# Patient Record
Sex: Male | Born: 1960 | Race: Black or African American | Hispanic: No | Marital: Single | Smoking: Former smoker
Health system: Southern US, Community
[De-identification: ages and names within clinical notes are randomized; demographics above are authoritative.]

## PROBLEM LIST (undated history)

## (undated) DIAGNOSIS — N200 Calculus of kidney: Secondary | ICD-10-CM

## (undated) DIAGNOSIS — E119 Type 2 diabetes mellitus without complications: Secondary | ICD-10-CM

## (undated) DIAGNOSIS — Z9989 Dependence on other enabling machines and devices: Secondary | ICD-10-CM

## (undated) DIAGNOSIS — Z8673 Personal history of transient ischemic attack (TIA), and cerebral infarction without residual deficits: Secondary | ICD-10-CM

## (undated) DIAGNOSIS — E785 Hyperlipidemia, unspecified: Secondary | ICD-10-CM

## (undated) DIAGNOSIS — I10 Essential (primary) hypertension: Secondary | ICD-10-CM

## (undated) DIAGNOSIS — G4733 Obstructive sleep apnea (adult) (pediatric): Secondary | ICD-10-CM

## (undated) DIAGNOSIS — E1143 Type 2 diabetes mellitus with diabetic autonomic (poly)neuropathy: Secondary | ICD-10-CM

## (undated) DIAGNOSIS — J449 Chronic obstructive pulmonary disease, unspecified: Secondary | ICD-10-CM

## (undated) DIAGNOSIS — I2510 Atherosclerotic heart disease of native coronary artery without angina pectoris: Secondary | ICD-10-CM

## (undated) HISTORY — PX: CAROTID ENDARTERECTOMY: SUR193

## (undated) HISTORY — PX: CORONARY ANGIOPLASTY WITH STENT PLACEMENT: SHX49

---

## 1998-06-10 HISTORY — PX: CORONARY ARTERY BYPASS GRAFT: SHX141

## 2013-09-08 DIAGNOSIS — I2510 Atherosclerotic heart disease of native coronary artery without angina pectoris: Secondary | ICD-10-CM

## 2013-09-08 HISTORY — DX: Atherosclerotic heart disease of native coronary artery without angina pectoris: I25.10

## 2013-09-30 ENCOUNTER — Inpatient Hospital Stay (HOSPITAL_COMMUNITY)
Admission: EM | Admit: 2013-09-30 | Discharge: 2013-10-02 | DRG: 246 | Disposition: A | Discharge status: Home / Self Care | Payer: Medicare Other | Attending: Cardiology | Admitting: Cardiology

## 2013-09-30 ENCOUNTER — Emergency Department (HOSPITAL_COMMUNITY): Payer: Medicare Other

## 2013-09-30 ENCOUNTER — Encounter (HOSPITAL_COMMUNITY): Payer: Self-pay | Admitting: Internal Medicine

## 2013-09-30 DIAGNOSIS — G4733 Obstructive sleep apnea (adult) (pediatric): Secondary | ICD-10-CM

## 2013-09-30 DIAGNOSIS — I509 Heart failure, unspecified: Secondary | ICD-10-CM | POA: Diagnosis present

## 2013-09-30 DIAGNOSIS — E669 Obesity, unspecified: Secondary | ICD-10-CM | POA: Diagnosis present

## 2013-09-30 DIAGNOSIS — I200 Unstable angina: Secondary | ICD-10-CM

## 2013-09-30 DIAGNOSIS — Z955 Presence of coronary angioplasty implant and graft: Secondary | ICD-10-CM

## 2013-09-30 DIAGNOSIS — E782 Mixed hyperlipidemia: Secondary | ICD-10-CM

## 2013-09-30 DIAGNOSIS — R079 Chest pain, unspecified: Secondary | ICD-10-CM

## 2013-09-30 DIAGNOSIS — I10 Essential (primary) hypertension: Secondary | ICD-10-CM

## 2013-09-30 DIAGNOSIS — Z794 Long term (current) use of insulin: Secondary | ICD-10-CM

## 2013-09-30 DIAGNOSIS — J4489 Other specified chronic obstructive pulmonary disease: Secondary | ICD-10-CM | POA: Diagnosis present

## 2013-09-30 DIAGNOSIS — Z951 Presence of aortocoronary bypass graft: Secondary | ICD-10-CM

## 2013-09-30 DIAGNOSIS — I5022 Chronic systolic (congestive) heart failure: Secondary | ICD-10-CM

## 2013-09-30 DIAGNOSIS — I2510 Atherosclerotic heart disease of native coronary artery without angina pectoris: Secondary | ICD-10-CM

## 2013-09-30 DIAGNOSIS — E1140 Type 2 diabetes mellitus with diabetic neuropathy, unspecified: Secondary | ICD-10-CM

## 2013-09-30 DIAGNOSIS — I255 Ischemic cardiomyopathy: Secondary | ICD-10-CM

## 2013-09-30 DIAGNOSIS — E876 Hypokalemia: Secondary | ICD-10-CM | POA: Diagnosis not present

## 2013-09-30 DIAGNOSIS — I249 Acute ischemic heart disease, unspecified: Secondary | ICD-10-CM

## 2013-09-30 DIAGNOSIS — E119 Type 2 diabetes mellitus without complications: Secondary | ICD-10-CM

## 2013-09-30 DIAGNOSIS — Z87891 Personal history of nicotine dependence: Secondary | ICD-10-CM

## 2013-09-30 DIAGNOSIS — Y831 Surgical operation with implant of artificial internal device as the cause of abnormal reaction of the patient, or of later complication, without mention of misadventure at the time of the procedure: Secondary | ICD-10-CM | POA: Diagnosis present

## 2013-09-30 DIAGNOSIS — E1169 Type 2 diabetes mellitus with other specified complication: Secondary | ICD-10-CM

## 2013-09-30 DIAGNOSIS — Z79899 Other long term (current) drug therapy: Secondary | ICD-10-CM

## 2013-09-30 DIAGNOSIS — E785 Hyperlipidemia, unspecified: Secondary | ICD-10-CM | POA: Diagnosis present

## 2013-09-30 DIAGNOSIS — Z7982 Long term (current) use of aspirin: Secondary | ICD-10-CM

## 2013-09-30 DIAGNOSIS — I2589 Other forms of chronic ischemic heart disease: Secondary | ICD-10-CM | POA: Diagnosis present

## 2013-09-30 DIAGNOSIS — J449 Chronic obstructive pulmonary disease, unspecified: Secondary | ICD-10-CM

## 2013-09-30 DIAGNOSIS — I5023 Acute on chronic systolic (congestive) heart failure: Secondary | ICD-10-CM | POA: Diagnosis present

## 2013-09-30 DIAGNOSIS — Z8673 Personal history of transient ischemic attack (TIA), and cerebral infarction without residual deficits: Secondary | ICD-10-CM

## 2013-09-30 DIAGNOSIS — T82897A Other specified complication of cardiac prosthetic devices, implants and grafts, initial encounter: Principal | ICD-10-CM | POA: Diagnosis present

## 2013-09-30 DIAGNOSIS — I639 Cerebral infarction, unspecified: Secondary | ICD-10-CM

## 2013-09-30 HISTORY — DX: Obstructive sleep apnea (adult) (pediatric): G47.33

## 2013-09-30 HISTORY — DX: Atherosclerotic heart disease of native coronary artery without angina pectoris: I25.10

## 2013-09-30 HISTORY — DX: Type 2 diabetes mellitus without complications: E11.9

## 2013-09-30 HISTORY — DX: Chronic obstructive pulmonary disease, unspecified: J44.9

## 2013-09-30 HISTORY — DX: Type 2 diabetes mellitus with diabetic autonomic (poly)neuropathy: E11.43

## 2013-09-30 HISTORY — DX: Hyperlipidemia, unspecified: E78.5

## 2013-09-30 HISTORY — DX: Personal history of transient ischemic attack (TIA), and cerebral infarction without residual deficits: Z86.73

## 2013-09-30 HISTORY — DX: Calculus of kidney: N20.0

## 2013-09-30 HISTORY — DX: Dependence on other enabling machines and devices: Z99.89

## 2013-09-30 HISTORY — DX: Essential (primary) hypertension: I10

## 2013-09-30 LAB — BASIC METABOLIC PANEL
BUN: 15 mg/dL (ref 6–23)
CHLORIDE: 88 meq/L — AB (ref 96–112)
CO2: 30 meq/L (ref 19–32)
Calcium: 10.3 mg/dL (ref 8.4–10.5)
Creatinine, Ser: 0.9 mg/dL (ref 0.50–1.35)
GFR calc Af Amer: >90 mL/min (ref >90)
GFR calc non Af Amer: >90 mL/min (ref >90)
GLUCOSE: 302 mg/dL — AB (ref 70–99)
Potassium: 3.2 mEq/L — ABNORMAL LOW (ref 3.7–5.3)
SODIUM: 134 meq/L — AB (ref 137–147)

## 2013-09-30 LAB — CBC
HCT: 44.5 % (ref 39.0–52.0)
HEMOGLOBIN: 14.8 g/dL (ref 13.0–17.0)
MCH: 29 pg (ref 26.0–34.0)
MCHC: 33.3 g/dL (ref 30.0–36.0)
MCV: 87.3 fL (ref 78.0–100.0)
PLATELETS: 231 10*3/uL (ref 150–400)
RBC: 5.1 MIL/uL (ref 4.22–5.81)
RDW: 12.4 % (ref 11.5–15.5)
WBC: 7.5 10*3/uL (ref 4.0–10.5)

## 2013-09-30 LAB — DIFFERENTIAL
BASOS ABS: 0 10*3/uL (ref 0.0–0.1)
Basophils Relative: 0 % (ref 0–1)
EOS PCT: 3 % (ref 0–5)
Eosinophils Absolute: 0.2 10*3/uL (ref 0.0–0.7)
LYMPHS PCT: 38 % (ref 12–46)
Lymphs Abs: 2.8 10*3/uL (ref 0.7–4.0)
Monocytes Absolute: 0.7 10*3/uL (ref 0.1–1.0)
Monocytes Relative: 10 % (ref 3–12)
Neutro Abs: 3.7 10*3/uL (ref 1.7–7.7)
Neutrophils Relative %: 49 % (ref 43–77)

## 2013-09-30 LAB — PROTIME-INR
INR: 0.95 (ref 0.00–1.49)
Prothrombin Time: 12.5 seconds (ref 11.6–15.2)

## 2013-09-30 LAB — GLUCOSE, CAPILLARY: GLUCOSE-CAPILLARY: 236 mg/dL — AB (ref 70–99)

## 2013-09-30 LAB — I-STAT TROPONIN, ED: Troponin i, poc: 0 ng/mL (ref 0.00–0.08)

## 2013-09-30 LAB — APTT: aPTT: 40 seconds — ABNORMAL HIGH (ref 24–37)

## 2013-09-30 MED ORDER — ASPIRIN 81 MG PO CHEW
324.0000 mg | CHEWABLE_TABLET | Freq: Once | ORAL | Status: AC
  Administered 2013-09-30: 324 mg via ORAL
  Filled 2013-09-30: qty 4

## 2013-09-30 MED ORDER — HEPARIN (PORCINE) IN NACL 100-0.45 UNIT/ML-% IJ SOLN
2000.0000 [IU]/h | INTRAMUSCULAR | Status: DC
  Administered 2013-09-30: 1800 [IU]/h via INTRAVENOUS
  Administered 2013-10-01: 2000 [IU]/h via INTRAVENOUS
  Filled 2013-09-30 (×4): qty 250

## 2013-09-30 MED ORDER — METOPROLOL TARTRATE 1 MG/ML IV SOLN
INTRAVENOUS | Status: AC
  Filled 2013-09-30: qty 5

## 2013-09-30 MED ORDER — GI COCKTAIL ~~LOC~~
30.0000 mL | Freq: Once | ORAL | Status: AC
  Administered 2013-09-30: 30 mL via ORAL
  Filled 2013-09-30: qty 30

## 2013-09-30 MED ORDER — MORPHINE SULFATE 4 MG/ML IJ SOLN
4.0000 mg | Freq: Once | INTRAMUSCULAR | Status: AC
Start: 2013-09-30 — End: 2013-09-30
  Administered 2013-09-30: 4 mg via INTRAVENOUS
  Filled 2013-09-30: qty 1

## 2013-09-30 MED ORDER — HEPARIN SODIUM (PORCINE) 5000 UNIT/ML IJ SOLN
3000.0000 [IU] | Freq: Once | INTRAMUSCULAR | Status: AC
  Administered 2013-09-30: 3000 [IU] via INTRAVENOUS
  Filled 2013-09-30: qty 0.6

## 2013-09-30 MED ORDER — MORPHINE SULFATE 2 MG/ML IJ SOLN
INTRAMUSCULAR | Status: AC
  Filled 2013-09-30: qty 1

## 2013-09-30 MED ORDER — FAMOTIDINE 20 MG PO TABS
20.0000 mg | ORAL_TABLET | Freq: Once | ORAL | Status: AC
  Administered 2013-09-30: 20 mg via ORAL
  Filled 2013-09-30: qty 1

## 2013-09-30 MED ORDER — POTASSIUM CHLORIDE CRYS ER 20 MEQ PO TBCR
60.0000 meq | EXTENDED_RELEASE_TABLET | Freq: Two times a day (BID) | ORAL | Status: DC
  Administered 2013-10-01: 60 meq via ORAL
  Filled 2013-09-30 (×2): qty 3

## 2013-09-30 MED ORDER — INSULIN GLARGINE 100 UNIT/ML ~~LOC~~ SOLN
60.0000 [IU] | Freq: Every day | SUBCUTANEOUS | Status: DC
  Administered 2013-10-01 (×2): 60 [IU] via SUBCUTANEOUS
  Filled 2013-09-30 (×3): qty 0.6

## 2013-09-30 MED ORDER — ONDANSETRON HCL 4 MG/2ML IJ SOLN
4.0000 mg | Freq: Once | INTRAMUSCULAR | Status: AC
  Administered 2013-09-30: 4 mg via INTRAVENOUS
  Filled 2013-09-30: qty 2

## 2013-09-30 MED ORDER — NITROGLYCERIN 0.4 MG SL SUBL
SUBLINGUAL_TABLET | SUBLINGUAL | Status: AC
  Filled 2013-09-30: qty 1

## 2013-09-30 NOTE — ED Notes (Addendum)
[facility] called for transport. 

## 2013-09-30 NOTE — ED Provider Notes (Signed)
CSN: [id number]     Arrival date & time 09/30/13  1925 History   First MD Initiated Contact with Patient 09/30/13 1936     Chief Complaint  Patient presents with  . Chest Pain     (Consider location/radiation/quality/duration/timing/severity/associated sxs/prior Treatment) Patient is a 53 y.o. male presenting with chest pain. The history is provided by the patient.  Chest Pain Associated symptoms: shortness of breath   Associated symptoms: no abdominal pain, no back pain, no cough, no fever, no headache, no nausea, no palpitations and not vomiting   pt with hx cad, cabg 2000 ([facility]), c/o mid to lower sternal chest tightness/burning since yesterday. States yesterday afternoon was exerting self, and had onset chest discomfort. Pain constant, persistent since. Mild sob. No nv or diaphoresis. Pt states different from what he recalls of chest discomfort prior to 2000.  Denies other recent cp or discomfort. Pain constant since onset. Today also notes a pain in region left shoulder/left trapezius. Denies radicular pain or arm numbness/weakness. No neck pain. No pleuritic pain. Chest discomfort no better whether upright or supine. Denies specific exacerbating or alleviating factors. Denies cough or uri c/o. No belching, bloating, gas or heartburn.      No past medical history on file. No past surgical history on file. No family history on file. History  Substance Use Topics  . Smoking status: Not on file  . Smokeless tobacco: Not on file  . Alcohol Use: Not on file    Review of Systems  Constitutional: Negative for fever and chills.  HENT: Negative for sore throat.   Eyes: Negative for redness.  Respiratory: Positive for shortness of breath. Negative for cough.   Cardiovascular: Positive for chest pain. Negative for palpitations and leg swelling.  Gastrointestinal: Negative for nausea, vomiting and abdominal pain.  Genitourinary: Negative for flank pain.  Musculoskeletal: Negative for back  pain and neck pain.  Skin: Negative for rash.  Neurological: Negative for headaches.  Hematological: Does not bruise/bleed easily.  Psychiatric/Behavioral: Negative for confusion.      Allergies  Review of patient's allergies indicates not on file.  Home Medications   Prior to Admission medications   Not on File   BP 147/97  Pulse 98  Temp(Src) 98.1 F (36.7 C) (Oral)  Resp 16  Ht 5\' 6"  (1.676 m)  SpO2 98% Physical Exam  Nursing note and vitals reviewed. Constitutional: He is oriented to person, place, and time. He appears well-developed and well-nourished. No distress.  HENT:  Mouth/Throat: Oropharynx is clear and moist.  Eyes: Conjunctivae are normal. No scleral icterus.  Neck: Neck supple. No tracheal deviation present.  Cardiovascular: Normal rate, regular rhythm, normal heart sounds and intact distal pulses.  Exam reveals no gallop and no friction rub.   No murmur heard. Pulmonary/Chest: Effort normal and breath sounds normal. No accessory muscle usage. No respiratory distress. He exhibits no tenderness.  Abdominal: Soft. Bowel sounds are normal. He exhibits no distension and no mass. There is no tenderness. There is no rebound and no guarding.  obese  Genitourinary:  No cva tenderness  Musculoskeletal: Normal range of motion. He exhibits no edema and no tenderness.  Neurological: He is alert and oriented to person, place, and time.  Skin: Skin is warm and dry. He is not diaphoretic.  Psychiatric: He has a normal mood and affect.    ED Course  Procedures (including critical care time)  Results for orders placed during the hospital encounter of 09/30/13  CBC  Result Value Ref Range   WBC 7.5  4.0 - 10.5 K/uL   RBC 5.10  4.22 - 5.81 MIL/uL   Hemoglobin 14.8  13.0 - 17.0 g/dL   HCT 40.944.5  81.139.0 - 91.452.0 %   MCV 87.3  78.0 - 100.0 fL   MCH 29.0  26.0 - 34.0 pg   MCHC 33.3  30.0 - 36.0 g/dL   RDW 78.212.4  95.611.5 - 21.315.5 %   Platelets 231  150 - 400 K/uL   DIFFERENTIAL      Result Value Ref Range   Neutrophils Relative % 49  43 - 77 %   Neutro Abs 3.7  1.7 - 7.7 K/uL   Lymphocytes Relative 38  12 - 46 %   Lymphs Abs 2.8  0.7 - 4.0 K/uL   Monocytes Relative 10  3 - 12 %   Monocytes Absolute 0.7  0.1 - 1.0 K/uL   Eosinophils Relative 3  0 - 5 %   Eosinophils Absolute 0.2  0.0 - 0.7 K/uL   Basophils Relative 0  0 - 1 %   Basophils Absolute 0.0  0.0 - 0.1 K/uL  I-STAT TROPOININ, ED      Result Value Ref Range   Troponin i, poc 0.00  0.00 - 0.08 ng/mL   Comment 3                 EKG Interpretation   Date/Time:  Thursday September 30 2013 19:34:05 EDT Ventricular Rate:  96 PR Interval:  201 QRS Duration: 106 QT Interval:  437 QTC Calculation: 552 R Axis:   -21 Text Interpretation:  Sinus rhythm Borderline prolonged PR interval Left  ventricular hypertrophy Prolonged QT interval `1 mm st elev II, III, AVF,  and V6 concerning for possible stemi, no old ecg to compare Confirmed by  [facility]  MD, Caryn BeeKEVIN ([id number]) on 09/30/2013 8:00:57 PM      MDM  ecg reviewed (pt still in triage).  As patient arrives to room, c/o mid to lower sternal cp since yesterday when exerting self/walking.  Given st elev ii, iii, avf and v6, and no old ecg to compare, code stemi activated.  Discussed pt with responding cardiologist, Dr Mendel RyderH Smith at [phone] - Dr Katrinka BlazingSmith reviewed ecg, and given persistent cp since yesterday, current ecg, requests cancel stemi, states to get labs, cxr, old records if possible, and [facility]  Asa. Heparin per pharmacy. Labs. Pcxr.   Reviewed nursing notes and prior charts for additional history.    [facility].  Trop returns neg - given 1 days cp, very reassuring.   In addition, cardiology fellow able to pull up old ecg in [facility] system, states current ecg very similar to prior.   Pt appears stable for transport to [facility].      Suzi RootsKevin E Trinidad Ingle, MD 09/30/13 2018

## 2013-09-30 NOTE — H&P (Signed)
Primary cardiologist: Devonne Doughty (RNP Vivia Budge as his assistant) at [employer] Complaint: Chest pain   HPI: This is a 53 year old African American male with past medical history of obesity, hyperlipidemia, hypertension, ischemic cardiomyopathy with systolic congestive heart failure was documented EF of 35%, known coronary artery disease status post CABG in 2002 please see details below as well as multiple PCI since that time who presented with chest pain. Of note patient received most of his care at [facility] however he recently moved to the area and therefore presented to [facility]. Patient reported that he started having symptoms on Wednesday, 09/29/2013. Around noon time he was doing his walker an odor to be more active. He reported walking uphill in downhill when he develop shortness of breath and then chest pressure that he described as not to his chest is 6/10 in intensity there was also associated with discomfort that he described as burning in his shoulder moving down his left arm. Those symptoms lasted for about 30 minutes and resolved after he came back home on the leg down his couch. Later in the day he had another episode of chest pressure that was shorter lasting and was associated with getting out of the house to visit his neighbor. This episode also stopped after patient rested. He didn't take nitroglycerin with dose episodes. On 09/30/2013 patient had multiple episodes lasting 5-10 minutes of chest pressure or 4/10 in intensity and shoulder the burning radiating down his arm at the same time 6/10 in intensity. Although sepsis were consistently brought up by exertion like walking around the house and usually stop after patient sit down his couch. Patient denied to me more episodes of dyspnea associated with the chest pain described above. He denied nausea or diaphoresis with episodes of his chest pain. Finally he went to the emergency department at [facility]. Giving  his mild ST elevation in inferior leads STEMI patient were initially activated however this was canceled and patient was transferred to [facility] for further management.   At the time of my evaluation patient is chest pain-free, laying comfortably in bed. He denied any recent lower extremity edema admitted having occasional PND (3-4 times a month) and bendopnea this is more frequent. He reported that he may have a low but more breathing troubles while bending over the last couple weeks. Denied nausea vomiting, abdominal distention or bloating. He denied chills or fevers, cough, bowel bladder disturbances. Patient denied any significant weight gain and reported that he probably lost some weight over the last couple months.  Patient denied any melena, bright red blood per rectum recently, history of gastric ulcers, nosebleeds, easy bruising. She reported an episode of bleeding during a bowel movement about a year ago for which he was evaluated in the emergency department and was sent home without any further workup. This sounded hemorrhoidal per description   Review of Systems:  12 systems were reviewed and were negative except mentioned in the HPI   Past Medical History: Past Medical History  Diagnosis Date  . CAD (coronary artery disease)   . OSA on CPAP   . COPD (chronic obstructive pulmonary disease)   . History of stroke   . Diabetes mellitus   . Hyperlipidemia   . HTN (hypertension)   . Peripheral autonomic neuropathy due to DM   . Nephrolithiasis    Past Surgical History  Procedure Laterality Date  . Carotid endarterectomy Left   . Coronary artery bypass graft  2000    left radial to diagonal, RIMA to OM and LIMA to LAD  . Coronary angioplasty with stent placement      bare metal x 2.4 x 12 and 4 x 8 to mid and distal RC    Medications: Prior to Admission medications   Medication Sig Start Date End Date Taking? Authorizing Provider  albuterol (PROVENTIL HFA;VENTOLIN HFA) 108  (90 BASE) MCG/ACT inhaler Inhale 1 puff into the lungs every 6 (six) hours as needed for wheezing or shortness of breath.   Yes Historical Provider, MD  aspirin 81 MG chewable tablet Chew 81 mg by mouth daily.   Yes Historical Provider, MD  carvedilol (COREG) 25 MG tablet Take 25 mg by mouth 2 (two) times daily with a meal.   Yes Historical Provider, MD  furosemide (LASIX) 40 MG tablet Take 40 mg by mouth daily.   Yes Historical Provider, MD  gabapentin (NEURONTIN) 300 MG capsule Take 600 mg by mouth 3 (three) times daily.   Yes Historical Provider, MD  insulin aspart (NOVOLOG) 100 UNIT/ML injection Inject 5-10 Units into the skin 3 (three) times daily before meals. 10 units with meals 5 units with snack   Yes Historical Provider, MD  insulin glargine (LANTUS) 100 UNIT/ML injection Inject 60 Units into the skin at bedtime.   Yes Historical Provider, MD  isosorbide mononitrate (IMDUR) 30 MG 24 hr tablet Take 30 mg by mouth daily.   Yes Historical Provider, MD  losartan (COZAAR) 50 MG tablet Take 50 mg by mouth daily.   Yes Historical Provider, MD  metFORMIN (GLUCOPHAGE) 1000 MG tablet Take 1,000 mg by mouth 2 (two) times daily with a meal.   Yes Historical Provider, MD  omeprazole (PRILOSEC) 20 MG capsule Take 20 mg by mouth daily.   Yes Historical Provider, MD  rosuvastatin (CRESTOR) 40 MG tablet Take 40 mg by mouth daily.   Yes Historical Provider, MD  tiotropium (SPIRIVA) 18 MCG inhalation capsule Place 18 mcg into inhaler and inhale daily.   Yes Historical Provider, MD    Allergies:  No Known Allergies  Social History:  reports that he quit smoking about 10 years ago. His smoking use included Cigarettes. He started smoking about 40 years ago. He has a 60 pack-year smoking history. He does not have any smokeless tobacco history on file. He reports that he drinks alcohol. He reports that he does not use illicit drugs.  Family History: Family History  Problem Relation Age of Onset  .  Peripheral vascular disease Mother   . Diabetes Mother   . Heart disease Mother   . Heart disease Father   . Diabetes Father   . Peripheral vascular disease Father     PHYSICAL EXAM:  Filed Vitals:   09/30/13 2200 09/30/13 2218 09/30/13 2306 09/30/13 2311  BP:  130/79 189/166 112/44  Pulse: 95 86 94 87  Temp:  97.7 F (36.5 C) 98.3 F (36.8 C)   TempSrc:  Oral Oral   Resp: 23 21 18 18   Height:   5\' 6"  (1.676 m)   Weight:   132.7 kg (292 lb 8.8 oz)   SpO2: 97% 99% 95% 99%   General:  Well appearing. No respiratory difficulty HEENT: normal Neck: supple. Status post CEA on the left. No lymphadenopathy or thryomegaly appreciated. Cor: Regular rate & rhythm. Distant heart sounds, no peripheral edema. JVD 3 cm above the clavicle with patient seated at 90. Able to lay flat comfortably. Sternotomy scar Lungs: clear to  auscultation bilaterally Abdomen: soft, nontender, nondistended. No hepatosplenomegaly. No bruits or masses. Good bowel sounds. Extremities: no cyanosis, clubbing, rash, edema Neuro: alert & oriented x 3, cranial nerves grossly intact. moves all 4 extremities w/o difficulty. Affect pleasant.  Labs on Admission:   Recent Labs  09/30/13 2000  NA 134*  K 3.2*  CL 88*  CO2 30  GLUCOSE 302*  BUN 15  CREATININE 0.90  CALCIUM 10.3   No results found for this basename: AST, ALT, ALKPHOS, BILITOT, PROT, ALBUMIN,  in the last 72 hours No results found for this basename: LIPASE, AMYLASE,  in the last 72 hours  Recent Labs  09/30/13 2000  WBC 7.5  NEUTROABS 3.7  HGB 14.8  HCT 44.5  MCV 87.3  PLT 231   No results found for this basename: CKTOTAL, CKMB, CKMBINDEX, TROPONINI,  in the last 72 hours No results found for this basename: TSH, T4TOTAL, FREET3, T3FREE, THYROIDAB,  in the last 72 hours No results found for this basename: VITAMINB12, FOLATE, FERRITIN, TIBC, IRON, RETICCTPCT,  in the last 72 hours  Radiological Exams on Admission (all images were  personally reviewed and interpreted by me, radiology reports were reviewed as well): Dg Chest Port 1 View  09/30/2013   CLINICAL DATA:  Left lateral neck pain radiating into the shoulder. Shortness of breath.  EXAM: PORTABLE CHEST - 1 VIEW  COMPARISON:  None.  FINDINGS: 2007 hr. The heart is mildly enlarged status post median sternotomy and CABG. The lungs are clear. There is no pleural effusion or pneumothorax. No acute osseous findings are evident.  IMPRESSION: Mild cardiomegaly post CABG.  No acute cardiopulmonary process.   Electronically Signed   By: Roxy HorsemanBill  Veazey M.D.   On: 09/30/2013 20:20    [facility]: Very technically difficult study. Mild left ventricular enlargement, there is probably inferior/posterior akinesis and otherwise moderately decreased contraction. LVEF of approximately 35%. Otherwise most of the other cardiac structures were not optimally imaged  EKG personally reviewed and interpreted by me 09/30/2013: Normal sinus rhythm, 90 beats per minute, left axis deviation, Q waves in II, III, aVF, V5-V6, LVH - [facility] and is not significantly changed  Problem list: Acute coronary syndrome - likely unstable angina / CAD status post CABG and PCI Mild acute on chronic systolic congestive heart failure exacerbation Diabetes mellitus type 2 Hyperlipidemia Sleep apnea on CPAP at home COPD without evidence of exacerbation  Assessment/Plan Acute coronary syndrome - likely unstable angina / CAD status post CABG and PCI Patient presented with typical anginal chest pain over the last 2 days, with minimal exertion, at least class III symptoms. Given his known coronary artery disease status post multiple interventions this likely represents acute coronary syndrome. Likely unstable angina given initial negative troponin on presentation despite 2 days of symptoms. Of volume overload is likely present however this probably does not contribute enough to  explain his chest pain  Load with Plavix 600 mg, continue Plavix 75 mg daily. Patient has full dose aspirin in the emergency department. Continue baby aspirin daily. Full dose heparin for ACS normal Gram. Continue home dose beta blocker, continue home dose Imdur. N.p.o. for possible procedure in the morning. Given typical symptoms will likely proceed with left heart catheterization  Mild acute on chronic systolic congestive heart failure exacerbation As evidenced by mild JVD and reported increased in frequency of bendopnea Check proBNP. Lasix 40 mg IV x1 and continued 40 mg by mouth daily. Patient may  go for left heart catheterization tomorrow we'll check LVEDP that will help [place] to guide further diuresis  Diabetes mellitus type 2 Lantus 60 units bedtime Sliding scale insulin otherwise Hold metformin while inpatient Hemoglobin A1c   Hyperlipidemia Patient is on home Crestor 40 mg with poorly controlled lipids according to [facility] labs. We'll check fasting lipid profile here. Atorvastatin 80 mg while in-house. May discuss further cholesterol-lowering therapies giving high burden of atherosclerotic disease   Sleep apnea on CPAP at home CPAP - RT to titrate at night for sleep apnea  COPD without evidence of exacerbation Continue home inhalers  Nolon Nations 09/30/2013, 11:40 PM

## 2013-09-30 NOTE — ED Notes (Signed)
X-ray at bedside

## 2013-09-30 NOTE — ED Notes (Signed)
Pt reports starting exercise yesterday and began to have chest pain that has persisted until today.  Pt is a/o x 4.  Skin warm and dry. Pt denies nausea or SOB.

## 2013-09-30 NOTE — Consult Note (Signed)
Interventional Cardiology Consult  Clinical Data:  I was contacted by [facility] at approximately 7:48 PM and instructed that Dr.Steinl was calling a STEMI on Mike Manning. The patient apparently has a complicated cardiac history with multiple prior MI and has [facility] of [facility].  The clinical story is that of greater than 24 hours of chest discomfort of low to moderate intensity. The EKG reveals inferior and extensive anterior Q-wave formation. There is nondiagnostic inferior and anterior ST elevation, but the patient's EKG does not reach criteria for acute ST elevation myocardial infarction, and could represent his chronic EKG data given his prior history of myocardial infarctions. Pericarditis would also be a consideration.  I recommended further evaluation including cardiac markers, chest x-ray, and other laboratory work be done. The general cardiologist on call should be contacted.   Rather than acute STEMI PROTOCOL with catheterization, evaluation and initial medical stabilization seems reasonable. If this is an infarction, it is greater than 24 hours old.  If enzymes are elevated and/or he can not be rendered pain free with medical measures, then urgent cath may still be necessary.

## 2013-09-30 NOTE — ED Notes (Signed)
Pt reports having left sided chest pain that developed yesterday while walking. Pt has a history of open heart surgery. Pt also reports left sided neck, shoulder, and arm pain. Pt reports nausea and intermittent shortness of breath, however denies emesis.

## 2013-09-30 NOTE — Progress Notes (Signed)
ANTICOAGULATION CONSULT NOTE - Initial Consult  Pharmacy Consult for Heparin Indication: chest pain/ACS  Allergies not on file  Patient Measurements: Height: 5\' 6"  (167.6 cm) Weight: 290 lb 8 oz (131.77 kg) IBW/kg (Calculated) : 63.8 Heparin Dosing Weight: 95 kg  Vital Signs: Temp: 98.1 F (36.7 C) (04/23 1931) Temp src: Oral (04/23 1931) BP: 147/97 mmHg (04/23 1931) Pulse Rate: 98 (04/23 1931)  Labs: No results found for this basename: HGB, HCT, PLT, APTT, LABPROT, INR, HEPARINUNFRC, CREATININE, CKTOTAL, CKMB, TROPONINI,  in the last 72 hours  CrCl is unknown because no creatinine reading has been taken.   Medical History: No past medical history on file.  Medications:   (Not in a hospital admission)  Assessment: 53 yo male with hx CAGB in 2000 presents to ED for Code STEMI. Pharmacy consulted to dose IV heparin. Baseline labs pending. Per RN, patient states that he does not take any blood thinners.  Goal of Therapy:  Heparin level 0.3-0.7 units/ml Monitor platelets by anticoagulation protocol: Yes   Plan:   Heparin 3000 units IV bolus  Heparin 1800 units/hr  Check heparin level in 6hrs  Daily heparin level, CBC   Loralee PacasErin Willye Javier, PharmD, BCPS Pager: [phone] 09/30/2013,7:58 PM

## 2013-09-30 NOTE — ED Notes (Signed)
Loyal JacobsonDolphus Ellithorpe- brother [phone]  Contact information

## 2013-10-01 ENCOUNTER — Encounter (HOSPITAL_COMMUNITY): Payer: Self-pay | Admitting: *Deleted

## 2013-10-01 ENCOUNTER — Ambulatory Visit (HOSPITAL_COMMUNITY)
Admission: RE | Admit: 2013-10-01 | Payer: Medicare Other | Source: Ambulatory Visit | Admitting: Interventional Cardiology

## 2013-10-01 ENCOUNTER — Encounter (HOSPITAL_COMMUNITY)
Admission: EM | Disposition: A | Discharge status: Home / Self Care | Payer: Medicare Other | Source: Home / Self Care | Attending: Cardiology

## 2013-10-01 DIAGNOSIS — I10 Essential (primary) hypertension: Secondary | ICD-10-CM

## 2013-10-01 DIAGNOSIS — Z794 Long term (current) use of insulin: Secondary | ICD-10-CM

## 2013-10-01 DIAGNOSIS — E782 Mixed hyperlipidemia: Secondary | ICD-10-CM

## 2013-10-01 DIAGNOSIS — G4733 Obstructive sleep apnea (adult) (pediatric): Secondary | ICD-10-CM

## 2013-10-01 DIAGNOSIS — I249 Acute ischemic heart disease, unspecified: Secondary | ICD-10-CM | POA: Diagnosis present

## 2013-10-01 DIAGNOSIS — E1169 Type 2 diabetes mellitus with other specified complication: Secondary | ICD-10-CM

## 2013-10-01 DIAGNOSIS — E119 Type 2 diabetes mellitus without complications: Secondary | ICD-10-CM

## 2013-10-01 DIAGNOSIS — J449 Chronic obstructive pulmonary disease, unspecified: Secondary | ICD-10-CM

## 2013-10-01 DIAGNOSIS — I639 Cerebral infarction, unspecified: Secondary | ICD-10-CM

## 2013-10-01 DIAGNOSIS — E1140 Type 2 diabetes mellitus with diabetic neuropathy, unspecified: Secondary | ICD-10-CM

## 2013-10-01 DIAGNOSIS — I2510 Atherosclerotic heart disease of native coronary artery without angina pectoris: Secondary | ICD-10-CM

## 2013-10-01 HISTORY — PX: LEFT HEART CATHETERIZATION WITH CORONARY/GRAFT ANGIOGRAM: SHX5450

## 2013-10-01 HISTORY — PX: PERCUTANEOUS CORONARY STENT INTERVENTION (PCI-S): SHX5485

## 2013-10-01 LAB — BASIC METABOLIC PANEL
BUN: 14 mg/dL (ref 6–23)
CHLORIDE: 87 meq/L — AB (ref 96–112)
CO2: 31 mEq/L (ref 19–32)
Calcium: 9.9 mg/dL (ref 8.4–10.5)
Creatinine, Ser: 0.89 mg/dL (ref 0.50–1.35)
GFR calc Af Amer: >90 mL/min (ref >90)
GFR calc non Af Amer: >90 mL/min (ref >90)
Glucose, Bld: 267 mg/dL — ABNORMAL HIGH (ref 70–99)
Potassium: 3.5 mEq/L — ABNORMAL LOW (ref 3.7–5.3)
Sodium: 134 mEq/L — ABNORMAL LOW (ref 137–147)

## 2013-10-01 LAB — CBC
HEMATOCRIT: 44.4 % (ref 39.0–52.0)
HEMOGLOBIN: 14.8 g/dL (ref 13.0–17.0)
MCH: 29.9 pg (ref 26.0–34.0)
MCHC: 33.3 g/dL (ref 30.0–36.0)
MCV: 89.7 fL (ref 78.0–100.0)
PLATELETS: 228 10*3/uL (ref 150–400)
RBC: 4.95 MIL/uL (ref 4.22–5.81)
RDW: 12.7 % (ref 11.5–15.5)
WBC: 6.5 10*3/uL (ref 4.0–10.5)

## 2013-10-01 LAB — GLUCOSE, CAPILLARY
GLUCOSE-CAPILLARY: 175 mg/dL — AB (ref 70–99)
Glucose-Capillary: 183 mg/dL — ABNORMAL HIGH (ref 70–99)
Glucose-Capillary: 121 mg/dL — ABNORMAL HIGH (ref 70–99)
Glucose-Capillary: 161 mg/dL — ABNORMAL HIGH (ref 70–99)

## 2013-10-01 LAB — LIPID PANEL
Cholesterol: 303 mg/dL — ABNORMAL HIGH (ref 0–200)
HDL: 36 mg/dL — ABNORMAL LOW (ref >39)
LDL Cholesterol: 222 mg/dL — ABNORMAL HIGH (ref 0–99)
Total CHOL/HDL Ratio: 8.4 RATIO
Triglycerides: 224 mg/dL — ABNORMAL HIGH (ref <150)
VLDL: 45 mg/dL — ABNORMAL HIGH (ref 0–40)

## 2013-10-01 LAB — PROTIME-INR
INR: 1 (ref 0.00–1.49)
PROTHROMBIN TIME: 13 s (ref 11.6–15.2)

## 2013-10-01 LAB — TROPONIN I
Troponin I: <0.3 ng/mL (ref <0.30)
Troponin I: <0.3 ng/mL (ref <0.30)
Troponin I: <0.3 ng/mL (ref <0.30)

## 2013-10-01 LAB — HEMOGLOBIN A1C
HEMOGLOBIN A1C: 10 % — AB (ref <5.7)
MEAN PLASMA GLUCOSE: 240 mg/dL — AB (ref <117)

## 2013-10-01 LAB — HEPARIN LEVEL (UNFRACTIONATED)
Heparin Unfractionated: 0.29 [IU]/mL — ABNORMAL LOW (ref 0.30–0.70)
Heparin Unfractionated: 0.56 IU/mL (ref 0.30–0.70)

## 2013-10-01 LAB — PRO B NATRIURETIC PEPTIDE: PRO B NATRI PEPTIDE: 126.2 pg/mL — AB (ref 0–125)

## 2013-10-01 LAB — POCT ACTIVATED CLOTTING TIME: Activated Clotting Time: 348 seconds

## 2013-10-01 LAB — MRSA PCR SCREENING: MRSA BY PCR: NEGATIVE

## 2013-10-01 SURGERY — LEFT HEART CATHETERIZATION WITH CORONARY/GRAFT ANGIOGRAM
Anesthesia: LOCAL | Laterality: Right

## 2013-10-01 MED ORDER — SODIUM CHLORIDE 0.9 % IJ SOLN: 3.0000 mL | INTRAMUSCULAR | Status: DC | PRN

## 2013-10-01 MED ORDER — ENOXAPARIN SODIUM 40 MG/0.4ML ~~LOC~~ SOLN
40.0000 mg | SUBCUTANEOUS | Status: DC
  Administered 2013-10-02: 08:00:00 40 mg via SUBCUTANEOUS
  Filled 2013-10-01 (×2): qty 0.4

## 2013-10-01 MED ORDER — ISOSORBIDE MONONITRATE ER 30 MG PO TB24
30.0000 mg | ORAL_TABLET | Freq: Every day | ORAL | Status: DC
  Administered 2013-10-01: 30 mg via ORAL
  Filled 2013-10-01: qty 1

## 2013-10-01 MED ORDER — CLOPIDOGREL BISULFATE 75 MG PO TABS
600.0000 mg | ORAL_TABLET | Freq: Once | ORAL | Status: AC
  Administered 2013-10-01: 02:00:00 600 mg via ORAL
  Filled 2013-10-01: qty 8

## 2013-10-01 MED ORDER — PANTOPRAZOLE SODIUM 40 MG PO TBEC
40.0000 mg | DELAYED_RELEASE_TABLET | Freq: Every day | ORAL | Status: DC
  Administered 2013-10-01 – 2013-10-02 (×2): 40 mg via ORAL
  Filled 2013-10-01 (×2): qty 1

## 2013-10-01 MED ORDER — FUROSEMIDE 10 MG/ML IJ SOLN
40.0000 mg | Freq: Once | INTRAMUSCULAR | Status: AC
  Administered 2013-10-01: 40 mg via INTRAVENOUS
  Filled 2013-10-01: qty 4

## 2013-10-01 MED ORDER — LIDOCAINE HCL (PF) 1 % IJ SOLN
INTRAMUSCULAR | Status: AC
  Filled 2013-10-01: qty 30

## 2013-10-01 MED ORDER — SODIUM CHLORIDE 0.9 % IV SOLN
INTRAVENOUS | Status: DC
  Administered 2013-10-01: 09:00:00 via INTRAVENOUS

## 2013-10-01 MED ORDER — HEPARIN (PORCINE) IN NACL 2-0.9 UNIT/ML-% IJ SOLN
INTRAMUSCULAR | Status: AC
  Filled 2013-10-01: qty 1000

## 2013-10-01 MED ORDER — SODIUM CHLORIDE 0.9 % IJ SOLN: 3.0000 mL | Freq: Two times a day (BID) | INTRAMUSCULAR | Status: DC

## 2013-10-01 MED ORDER — TIOTROPIUM BROMIDE MONOHYDRATE 18 MCG IN CAPS
18.0000 ug | ORAL_CAPSULE | Freq: Every day | RESPIRATORY_TRACT | Status: DC
  Administered 2013-10-01: 18 ug via RESPIRATORY_TRACT
  Filled 2013-10-01: qty 5

## 2013-10-01 MED ORDER — ALBUTEROL SULFATE (2.5 MG/3ML) 0.083% IN NEBU: 3.0000 mL | INHALATION_SOLUTION | Freq: Four times a day (QID) | RESPIRATORY_TRACT | Status: DC | PRN

## 2013-10-01 MED ORDER — FUROSEMIDE 40 MG PO TABS
40.0000 mg | ORAL_TABLET | Freq: Every day | ORAL | Status: DC
Start: 2013-10-01 — End: 2013-10-02
  Administered 2013-10-01 – 2013-10-02 (×2): 40 mg via ORAL
  Filled 2013-10-01 (×2): qty 1

## 2013-10-01 MED ORDER — INSULIN ASPART 100 UNIT/ML ~~LOC~~ SOLN
0.0000 [IU] | Freq: Three times a day (TID) | SUBCUTANEOUS | Status: DC
  Administered 2013-10-01: 15:00:00 4 [IU] via SUBCUTANEOUS
  Administered 2013-10-02: 08:00:00 7 [IU] via SUBCUTANEOUS

## 2013-10-01 MED ORDER — CARVEDILOL 25 MG PO TABS
25.0000 mg | ORAL_TABLET | Freq: Two times a day (BID) | ORAL | Status: DC
  Administered 2013-10-01 – 2013-10-02 (×3): 25 mg via ORAL
  Filled 2013-10-01 (×5): qty 1

## 2013-10-01 MED ORDER — ISOSORBIDE MONONITRATE ER 60 MG PO TB24
60.0000 mg | ORAL_TABLET | Freq: Every day | ORAL | Status: DC
  Administered 2013-10-02: 60 mg via ORAL
  Filled 2013-10-01: qty 1

## 2013-10-01 MED ORDER — SODIUM CHLORIDE 0.9 % IV SOLN: INTRAVENOUS | Status: AC

## 2013-10-01 MED ORDER — HEPARIN (PORCINE) IN NACL 2-0.9 UNIT/ML-% IJ SOLN
INTRAMUSCULAR | Status: AC
  Filled 2013-10-01: qty 500

## 2013-10-01 MED ORDER — ATORVASTATIN CALCIUM 80 MG PO TABS
80.0000 mg | ORAL_TABLET | Freq: Every day | ORAL | Status: DC
  Administered 2013-10-01: 22:00:00 80 mg via ORAL
  Filled 2013-10-01 (×2): qty 1

## 2013-10-01 MED ORDER — FENTANYL CITRATE 0.05 MG/ML IJ SOLN
INTRAMUSCULAR | Status: AC
  Filled 2013-10-01: qty 2

## 2013-10-01 MED ORDER — POTASSIUM CHLORIDE CRYS ER 20 MEQ PO TBCR: 60.0000 meq | EXTENDED_RELEASE_TABLET | Freq: Once | ORAL | Status: AC

## 2013-10-01 MED ORDER — NITROGLYCERIN 0.4 MG SL SUBL: 0.4000 mg | SUBLINGUAL_TABLET | SUBLINGUAL | Status: DC | PRN

## 2013-10-01 MED ORDER — SODIUM CHLORIDE 0.9 % IV SOLN
0.2500 mg/kg/h | INTRAVENOUS | Status: DC
  Administered 2013-10-01: 18:00:00 0.25 mg/kg/h via INTRAVENOUS
  Filled 2013-10-01: qty 250

## 2013-10-01 MED ORDER — ASPIRIN EC 81 MG PO TBEC
81.0000 mg | DELAYED_RELEASE_TABLET | Freq: Every day | ORAL | Status: DC
  Administered 2013-10-02: 10:00:00 81 mg via ORAL
  Filled 2013-10-01: qty 1

## 2013-10-01 MED ORDER — CLOPIDOGREL BISULFATE 75 MG PO TABS
75.0000 mg | ORAL_TABLET | Freq: Every day | ORAL | Status: DC
  Administered 2013-10-02: 08:00:00 75 mg via ORAL
  Filled 2013-10-01: qty 1

## 2013-10-01 MED ORDER — ADENOSINE (DIAGNOSTIC) 3 MG/ML IV SOLN
20.0000 mL | Freq: Once | INTRAVENOUS | Status: DC
  Filled 2013-10-01: qty 20

## 2013-10-01 MED ORDER — GABAPENTIN 300 MG PO CAPS
600.0000 mg | ORAL_CAPSULE | Freq: Three times a day (TID) | ORAL | Status: DC
  Administered 2013-10-01 – 2013-10-02 (×4): 600 mg via ORAL
  Filled 2013-10-01 (×6): qty 2

## 2013-10-01 MED ORDER — OXYCODONE-ACETAMINOPHEN 5-325 MG PO TABS
1.0000 | ORAL_TABLET | ORAL | Status: DC | PRN
  Administered 2013-10-01 – 2013-10-02 (×2): 2 via ORAL
  Filled 2013-10-01 (×2): qty 2

## 2013-10-01 MED ORDER — ASPIRIN 81 MG PO CHEW
81.0000 mg | CHEWABLE_TABLET | Freq: Every day | ORAL | Status: DC
  Filled 2013-10-01: qty 1

## 2013-10-01 MED ORDER — BIVALIRUDIN 250 MG IV SOLR
INTRAVENOUS | Status: AC
  Filled 2013-10-01: qty 250

## 2013-10-01 MED ORDER — MIDAZOLAM HCL 2 MG/2ML IJ SOLN
INTRAMUSCULAR | Status: AC
  Filled 2013-10-01: qty 2

## 2013-10-01 MED ORDER — LOSARTAN POTASSIUM 50 MG PO TABS
50.0000 mg | ORAL_TABLET | Freq: Every day | ORAL | Status: DC
  Administered 2013-10-01 – 2013-10-02 (×2): 50 mg via ORAL
  Filled 2013-10-01 (×2): qty 1

## 2013-10-01 MED ORDER — SODIUM CHLORIDE 0.9 % IV SOLN: 250.0000 mL | INTRAVENOUS | Status: DC | PRN

## 2013-10-01 MED ORDER — NITROGLYCERIN 0.2 MG/ML ON CALL CATH LAB
INTRAVENOUS | Status: AC
  Filled 2013-10-01: qty 1

## 2013-10-01 NOTE — Progress Notes (Signed)
Patient c/o of pain to left neck to shoulder pain when he went to lay on his left side. Then he sat up and moved a certain way which  made it hurt again it seems to be a positional pain where he had a previous surgery  in August per patient. A warm pack was given that helped relieve the pain I will continue to monitor.

## 2013-10-01 NOTE — Progress Notes (Signed)
ANTICOAGULATION CONSULT NOTE - Follow Up Consult  Pharmacy Consult for heparin Indication: chest pain/ACS  Labs:  Recent Labs  09/30/13 2000 10/01/13 0335  HGB 14.8 14.8  HCT 44.5 44.4  PLT 231 228  APTT 40*  --   LABPROT 12.5 13.0  INR 0.95 1.00  HEPARINUNFRC  --  0.29*  CREATININE 0.90  --     Assessment: 52yo male slightly subtherapeutic on heparin with initial dosing for CP.  Goal of Therapy:  Heparin level 0.3-0.7 units/ml   Plan:  Will increase heparin gtt by 1-2 units/kg/hr to 2000 units/hr and check level in 6hr.  Mike GamblesVeronda Hershel Manning, PharmD, BCPS  10/01/2013,4:49 AM

## 2013-10-01 NOTE — H&P (View-Only) (Signed)
TELEMETRY: Reviewed telemetry pt in NSR: Filed Vitals:   10/01/13 0100 10/01/13 0400 10/01/13 0513 10/01/13 0700  BP: 145/127 113/90 150/80 119/102  Pulse: 103 89 89 79  Temp:   98.3 F (36.8 C) 97.6 F (36.4 C)  TempSrc:   Oral Oral  Resp:   18 16  Height:      Weight:   292 lb 8.8 oz (132.7 kg)   SpO2: 99% 91% 91%     Intake/Output Summary (Last 24 hours) at 10/01/13 0819 Last data filed at 10/01/13 0600  Gross per 24 hour  Intake  176.2 ml  Output      0 ml  Net  176.2 ml    SUBJECTIVE Complained of left shoulder pain last night. Resolved on its own. No dyspnea or chest pain.  LABS: Basic Metabolic Panel:  Recent Labs  81/19/1404/23/15 2000 10/01/13 0335  NA 134* 134*  K 3.2* 3.5*  CL 88* 87*  CO2 30 31  GLUCOSE 302* 267*  BUN 15 14  CREATININE 0.90 0.89  CALCIUM 10.3 9.9   Liver Function Tests: No results found for this basename: AST, ALT, ALKPHOS, BILITOT, PROT, ALBUMIN,  in the last 72 hours No results found for this basename: LIPASE, AMYLASE,  in the last 72 hours CBC:  Recent Labs  09/30/13 2000 10/01/13 0335  WBC 7.5 6.5  NEUTROABS 3.7  --   HGB 14.8 14.8  HCT 44.5 44.4  MCV 87.3 89.7  PLT 231 228   Cardiac Enzymes:  Recent Labs  10/01/13 0335  TROPONINI <0.30   BNP: 126.2  Hemoglobin A1C: No results found for this basename: HGBA1C,  in the last 72 hours Fasting Lipid Panel:  Recent Labs  10/01/13 0335  CHOL 303*  HDL 36*  LDLCALC 222*  TRIG 224*  CHOLHDL 8.4   Thyroid Function Tests: No results found for this basename: TSH, T4TOTAL, FREET3, T3FREE, THYROIDAB,  in the last 72 hours  Radiology/Studies:  Dg Chest Port 1 View  09/30/2013   CLINICAL DATA:  Left lateral neck pain radiating into the shoulder. Shortness of breath.  EXAM: PORTABLE CHEST - 1 VIEW  COMPARISON:  None.  FINDINGS: 2007 hr. The heart is mildly enlarged status post median sternotomy and CABG. The lungs are clear. There is no pleural effusion or pneumothorax.  No acute osseous findings are evident.  IMPRESSION: Mild cardiomegaly post CABG.  No acute cardiopulmonary process.   Electronically Signed   By: Roxy HorsemanBill  Veazey M.D.   On: 09/30/2013 20:20   Ecg: NSR with old inferior and anterior infarcts.   PHYSICAL EXAM General: Well developed, obese, in no acute distress. Head: Normocephalic, atraumatic, sclera non-icteric, no xanthomas, nares are without discharge. Neck: Negative for carotid bruits. Old left CEA scar.  JVD not elevated. Lungs: Clear bilaterally to auscultation without wheezes, rales, or rhonchi. Breathing is unlabored. Heart: RRR S1 S2 without murmurs, rubs, or gallops. Old sternotomy scar. Abdomen: Soft, non-tender, non-distended with normoactive bowel sounds. Obese.  No hepatomegaly. No rebound/guarding. No obvious abdominal masses. Msk:  Strength and tone appears normal for age. Extremities: No clubbing, cyanosis or edema.  Distal pedal pulses are 2+ and equal bilaterally. Spine: old surgical scar in upper thoracic spine. Neuro: Alert and oriented X 3. Moves all extremities spontaneously. Psych:  Responds to questions appropriately with a normal affect.  ASSESSMENT AND PLAN: 1. Unstable angina with class IV angina despite maximal medical therapy. S/p CABG in 2000 with LIMA to LAD, RIMA to OM, and  left radial to diagonal. S/p stenting of the RCA x 2. ? Last in 2005. Now troponins negative for MI. Left shoulder pain last night c/w angina. Continue IV heparin. Plavix added on admit. Continue ASA, beta blocker, nitrates, statin. Plan cardiac cath today via femoral approach. The procedure and risks were reviewed including but not limited to death, myocardial infarction, stroke, arrythmias, bleeding, transfusion, emergency surgery, dye allergy, or renal dysfunction. The patient voices understanding and is agreeable to proceed.. 2. Chronic systolic CHF with ischemic cardiomyopathy. EF 35% in past. Will reassess with cath today. Check Echo. Does not  appear to be volume overloaded. Continue Coreg, losartan and lasix. 3. DM type 2 insulin dependent. Poorly controlled. Patient reports he hasn't taken insulin as prescribed. Was on Lantus 60 mg bid with 10 units of regular with meals. He wasn't using meal coverage. Will continue Lantus for now with SSI. Metformin on hold for cath. 4. Hyperlipidemia. Combined. On high dose lipitor now.  5. Obesity with OSA. 6. History of COPD. 7. S/p left CEA. 8. Hypokalemia-replete    Active Problems:   Chest pain   ACS (acute coronary syndrome)    Signed, Solmon Bohr M [place] MD,FACC 10/01/2013 8:32 AM

## 2013-10-01 NOTE — Progress Notes (Signed)
 TELEMETRY: Reviewed telemetry pt in NSR: Filed Vitals:   10/01/13 0100 10/01/13 0400 10/01/13 0513 10/01/13 0700  BP: 145/127 113/90 150/80 119/102  Pulse: 103 89 89 79  Temp:   98.3 F (36.8 C) 97.6 F (36.4 C)  TempSrc:   Oral Oral  Resp:   18 16  Height:      Weight:   292 lb 8.8 oz (132.7 kg)   SpO2: 99% 91% 91%     Intake/Output Summary (Last 24 hours) at 10/01/13 0819 Last data filed at 10/01/13 0600  Gross per 24 hour  Intake  176.2 ml  Output      0 ml  Net  176.2 ml    SUBJECTIVE Complained of left shoulder pain last night. Resolved on its own. No dyspnea or chest pain.  LABS: Basic Metabolic Panel:  Recent Labs  09/30/13 2000 10/01/13 0335  NA 134* 134*  K 3.2* 3.5*  CL 88* 87*  CO2 30 31  GLUCOSE 302* 267*  BUN 15 14  CREATININE 0.90 0.89  CALCIUM 10.3 9.9   Liver Function Tests: No results found for this basename: AST, ALT, ALKPHOS, BILITOT, PROT, ALBUMIN,  in the last 72 hours No results found for this basename: LIPASE, AMYLASE,  in the last 72 hours CBC:  Recent Labs  09/30/13 2000 10/01/13 0335  WBC 7.5 6.5  NEUTROABS 3.7  --   HGB 14.8 14.8  HCT 44.5 44.4  MCV 87.3 89.7  PLT 231 228   Cardiac Enzymes:  Recent Labs  10/01/13 0335  TROPONINI <0.30   BNP: 126.2  Hemoglobin A1C: No results found for this basename: HGBA1C,  in the last 72 hours Fasting Lipid Panel:  Recent Labs  10/01/13 0335  CHOL 303*  HDL 36*  LDLCALC 222*  TRIG 224*  CHOLHDL 8.4   Thyroid Function Tests: No results found for this basename: TSH, T4TOTAL, FREET3, T3FREE, THYROIDAB,  in the last 72 hours  Radiology/Studies:  Dg Chest Port 1 View  09/30/2013   CLINICAL DATA:  Left lateral neck pain radiating into the shoulder. Shortness of breath.  EXAM: PORTABLE CHEST - 1 VIEW  COMPARISON:  None.  FINDINGS: 2007 hr. The heart is mildly enlarged status post median sternotomy and CABG. The lungs are clear. There is no pleural effusion or pneumothorax.  No acute osseous findings are evident.  IMPRESSION: Mild cardiomegaly post CABG.  No acute cardiopulmonary process.   Electronically Signed   By: Bill  Veazey M.D.   On: 09/30/2013 20:20   Ecg: NSR with old inferior and anterior infarcts.   PHYSICAL EXAM General: Well developed, obese, in no acute distress. Head: Normocephalic, atraumatic, sclera non-icteric, no xanthomas, nares are without discharge. Neck: Negative for carotid bruits. Old left CEA scar.  JVD not elevated. Lungs: Clear bilaterally to auscultation without wheezes, rales, or rhonchi. Breathing is unlabored. Heart: RRR S1 S2 without murmurs, rubs, or gallops. Old sternotomy scar. Abdomen: Soft, non-tender, non-distended with normoactive bowel sounds. Obese.  No hepatomegaly. No rebound/guarding. No obvious abdominal masses. Msk:  Strength and tone appears normal for age. Extremities: No clubbing, cyanosis or edema.  Distal pedal pulses are 2+ and equal bilaterally. Spine: old surgical scar in upper thoracic spine. Neuro: Alert and oriented X 3. Moves all extremities spontaneously. Psych:  Responds to questions appropriately with a normal affect.  ASSESSMENT AND PLAN: 1. Unstable angina with class IV angina despite maximal medical therapy. S/p CABG in 2000 with LIMA to LAD, RIMA to OM, and   left radial to diagonal. S/p stenting of the RCA x 2. ? Last in 2005. Now troponins negative for MI. Left shoulder pain last night c/w angina. Continue IV heparin. Plavix added on admit. Continue ASA, beta blocker, nitrates, statin. Plan cardiac cath today via femoral approach. The procedure and risks were reviewed including but not limited to death, myocardial infarction, stroke, arrythmias, bleeding, transfusion, emergency surgery, dye allergy, or renal dysfunction. The patient voices understanding and is agreeable to proceed.. 2. Chronic systolic CHF with ischemic cardiomyopathy. EF 35% in past. Will reassess with cath today. Check Echo. Does not  appear to be volume overloaded. Continue Coreg, losartan and lasix. 3. DM type 2 insulin dependent. Poorly controlled. Patient reports he hasn't taken insulin as prescribed. Was on Lantus 60 mg bid with 10 units of regular with meals. He wasn't using meal coverage. Will continue Lantus for now with SSI. Metformin on hold for cath. 4. Hyperlipidemia. Combined. On high dose lipitor now.  5. Obesity with OSA. 6. History of COPD. 7. S/p left CEA. 8. Hypokalemia-replete    Active Problems:   Chest pain   ACS (acute coronary syndrome)    Signed, Peter M [place] MD,FACC 10/01/2013 8:32 AM

## 2013-10-01 NOTE — Interval H&P Note (Signed)
History and Physical Interval Note:  10/01/2013 3:12 PM Clinical data reviewed. Patient and reviewed. Patient examined. Prior coronary bypass grafting, all arterial with LIMA to LAD, RIMA to obtuse marginal, and free radial to diagonal. He also has history of RCA stenting toCath Lab Visit (complete for each Cath Lab visit)  Clinical Evaluation Leading to the Procedure:   ACS: yes  Non-ACS:    Anginal Classification: CCS IV  Anti-ischemic medical therapy: Maximal Therapy (2 or more classes of medications)  Non-Invasive Test Results: No non-invasive testing performed  Prior CABG: Previous CABG       Mike Manning  has presented today for surgery, with the diagnosis of cp  The various methods of treatment have been discussed with the patient and family. After consideration of risks, benefits and other options for treatment, the patient has consented to  Procedure(s): LEFT HEART CATHETERIZATION WITH CORONARY/GRAFT ANGIOGRAM (N/A) as a surgical intervention .  The patient's history has been reviewed, patient examined, no change in status, stable for surgery.  I have reviewed the patient's chart and labs.  Questions were answered to the patient's satisfaction.     Mike Manning

## 2013-10-01 NOTE — Progress Notes (Signed)
UR completed Saniyya Gau K. Hollye Pritt, RN, BSN, MSHL, CCM  10/01/2013 8:40 AM

## 2013-10-01 NOTE — CV Procedure (Signed)
Left Heart Catheterization with Coronary, Bypass Graft Angiography and PCI Report  Mike Manning  53 y.o.  male 08/24/1960  Procedure Date: 10/01/2013 Referring Physician: Peter [place]Jordan, M.D. Primary Cardiologist: Peter [place], M.D.  INDICATIONS: Unstable angina pectoris, prior coronary bypass grafting, systolic heart failure  PROCEDURE: 1. Left heart catheterization; 2. Coronary angiography; 3. Left ventriculography; 4. Bypass graft angiography; 5. Left and right internal mammary artery angiography; 6. FFR proximal and mid RCA; 7. DES proximal to mid RCA  CONSENT:  The risks, benefits, and details of the procedure were explained in detail to the patient. Risks including death, stroke, heart attack, kidney injury, allergy, limb ischemia, bleeding and radiation injury were discussed.  The patient verbalized understanding and wanted to proceed.  Informed written consent was obtained.  PROCEDURE TECHNIQUE:  After Xylocaine anesthesia a 5 French sheath was placed in the right femoral artery using the modified Seldinger technique.  Coronary angiography was done using a 5 F A2 multipurpose and internal mammary artery cath. catheter.  Left ventriculography was done using the 5 French A2 MP catheter and hand injection.   The bypass grafts originating from the aorta would not marked. We initially felt that the right internal mammary artery graft originated from the right subclavian. We later found out that that was a free RIMA to the obtuse marginal. After analyzing the angiographic data the areas threatened by significant obstruction and likely the cause of the patient's unstable angina is a native right coronary. He was found to have significant in-stent restenosis which was documented by FFR of 0.77 across tandem lesions in the proximal and mid RCA. The distal RCA including the PDA and left ventricular branches contains significant diffuse multifocal stenoses.  After contemplating ways to help  this patient we decided to perform PCI on the tandem lesions in the proximal/mid RCA which included the region of restenosis in the mid vessel. The hope is that improved and looked flow we'll decrease his anginal grade despite relatively diffuse multifocal diabetic disease in the PDA and left ventricular branches.  We used a 6 [place]French JR 4 guide catheter after upgrading the sheath from 5-6 [place]. This guide catheter was used to performed FFR. An adenosine infusion was given and the stenosis across the tandem lesions resulted in a value of 0.77. We then removed the Prime wire and performed PCI using a 0.014 Pro-water 190 cm wire. Predilatation was performed using a 3.5 x 10 mm cutting balloon. We then positioned and deployed a 33 x 4.0 mm Xience Alpine drug-eluting stent. We deployed the stent at 15 atmospheres. We then used a 4.5 x 20 mm Hillman Trek and post dilated the entirety of the stent to 16 atmospheres in overlapping fashion. The final result was acceptable. TIMI grade 3 flow was noted. The patient remained comfortable during the procedure without significant episodes of chest pain.  Angio-Seal was used to achieve hemostasis without difficulty.   CONTRAST:  Total of 280 cc.  COMPLICATIONS:  None   HEMODYNAMICS:  Aortic pressure 110/71 mmHg; LV pressure 112/5 mmHg; LVEDP 10 mm mercury  ANGIOGRAPHIC DATA:   The left main coronary artery is patent but contains distal 95% stenosis..  The left anterior descending artery is highly diseased with a 95% proximal stenosis and then appears to be totally occluded but there is evidence of patency has been noted by negative washout from the internal mammary graft to the LAD.  The left circumflex artery is totally occluded.  The right coronary artery  is large and dominant. A mid vessel stent contains segmental 7580% obstruction. The proximal RCA contains segmental 50% stenosis. As noted above FFR across this region was 0.77 the distal right coronary gives a  large PDA that has tandem 80 and 95% stenoses followed by diffuse disease. The continuation of the right coronary beyond the PDA contains diffuse 80-90% obstruction before 3 small left ventricular branches. It is felt of the right coronary territory is a source of the patient's exertional angina.Marland Kitchen.  BYPASS GRAFT ANGIOGRAPHY: The free right internal mammary to the obtuse marginal is widely patent  The free radial graft to the diagonal is widely patent  The left internal mammary graft to the LAD is widely patent  PCI RESULTS: After demonstrating hemodynamic significance with FFR, the tandem 50 and 80% stenoses in the proximal and mid right coronary were reduced to 0% with TIMI grade 3 flow after stenting with a 33 mm long 4.0 Xience DES postdilated to 4.5 mm in diameter.  LEFT VENTRICULOGRAM:  Left ventricular angiogram was done in the 30 RAO projection and revealed a dilated cavity with global hypokinesis and an estimated ejection fraction of 25-30%.   IMPRESSIONS:  1. 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 The distal RCA including the PDA and left ventricular branches are diffusely diseased.  2. Patent bypass grafts with LIMA to LAD, free radial graft to diagonal, and free right internal mammary graft to obtuse marginal showing no evidence of obstruction.  3. Successful stenting of the proximal to mid right coronary from 50/80% to 0% with TIMI grade 3 flow. The right coronary distal territory including the PDA and left ventricular branches a left with multifocal significant stenoses which may continue to be a source of angina although I believe his exercise tolerance will be improved by increasing inflow.  4. Ischemic cardiomyopathy   RECOMMENDATION:  Aspirin and Plavix. This should continue  indefinitely.  Ambulate when bed rest is completely  Consider for discharge in a.m. if anginal discomfort is improved.

## 2013-10-01 NOTE — Care Management Note (Addendum)
  Page 1 of 1   10/01/2013     8:43:19 AM CARE MANAGEMENT NOTE 10/01/2013  Patient:  Mike Manning,Mike Manning   Account Number:  [id number]  Date Initiated:  10/01/2013  Documentation initiated by:  Donato SchultzHUTCHINSON,Lyndle Pang  Subjective/Objective Assessment:   Admitted with CP     Action/Plan:   CM to follow for dispositon needs   Anticipated DC Date:  10/02/2013   Anticipated DC Plan:  HOME/SELF CARE         Choice offered to / List presented to:             Status of service:  In process, will continue to follow Medicare Important Message given?   (If response is "NO", the following Medicare IM given date fields will be blank) Date Medicare IM given:   Date Additional Medicare IM given:    Discharge Disposition:    Per UR Regulation:  Reviewed for med. necessity/level of care/duration of stay  If discussed at Long Length of Stay Meetings, dates discussed:    Comments:

## 2013-10-01 NOTE — Progress Notes (Signed)
53 yo male with hx CAGB in 2000 [facility]. Pharmacy consulted to dose IV heparin. Baseline labs wnl. Per RN, patient states that he does not take any blood thinners.  Anticoag: heparin for STEMI; HL this am was low and heparin drip was increased to 2000 units/hr.  Heparin level 0.56.  Hgb 14.8 and Plt 228 K; INR 1.  Will have cath via femoral apprach today  6hr heparin level 0.3-0.7  Plan: 1) Continue heparin at 2000 units/hr.  2) f/u after cath.

## 2013-10-02 ENCOUNTER — Encounter (HOSPITAL_COMMUNITY): Payer: Self-pay | Admitting: Emergency Medicine

## 2013-10-02 ENCOUNTER — Emergency Department (HOSPITAL_COMMUNITY)
Admission: EM | Admit: 2013-10-02 | Discharge: 2013-10-02 | Disposition: A | Discharge status: Home / Self Care | Payer: Medicare Other | Attending: Emergency Medicine | Admitting: Emergency Medicine

## 2013-10-02 ENCOUNTER — Encounter (HOSPITAL_COMMUNITY): Payer: Self-pay | Admitting: Adult Health

## 2013-10-02 ENCOUNTER — Emergency Department (HOSPITAL_COMMUNITY): Payer: Medicare Other

## 2013-10-02 DIAGNOSIS — E1149 Type 2 diabetes mellitus with other diabetic neurological complication: Secondary | ICD-10-CM | POA: Insufficient documentation

## 2013-10-02 DIAGNOSIS — Z79899 Other long term (current) drug therapy: Secondary | ICD-10-CM | POA: Insufficient documentation

## 2013-10-02 DIAGNOSIS — J449 Chronic obstructive pulmonary disease, unspecified: Secondary | ICD-10-CM | POA: Insufficient documentation

## 2013-10-02 DIAGNOSIS — Z7902 Long term (current) use of antithrombotics/antiplatelets: Secondary | ICD-10-CM | POA: Insufficient documentation

## 2013-10-02 DIAGNOSIS — J4489 Other specified chronic obstructive pulmonary disease: Secondary | ICD-10-CM | POA: Insufficient documentation

## 2013-10-02 DIAGNOSIS — I2589 Other forms of chronic ischemic heart disease: Secondary | ICD-10-CM

## 2013-10-02 DIAGNOSIS — E785 Hyperlipidemia, unspecified: Secondary | ICD-10-CM | POA: Insufficient documentation

## 2013-10-02 DIAGNOSIS — Z8673 Personal history of transient ischemic attack (TIA), and cerebral infarction without residual deficits: Secondary | ICD-10-CM | POA: Insufficient documentation

## 2013-10-02 DIAGNOSIS — G4733 Obstructive sleep apnea (adult) (pediatric): Secondary | ICD-10-CM | POA: Insufficient documentation

## 2013-10-02 DIAGNOSIS — Z87442 Personal history of urinary calculi: Secondary | ICD-10-CM | POA: Insufficient documentation

## 2013-10-02 DIAGNOSIS — G909 Disorder of the autonomic nervous system, unspecified: Secondary | ICD-10-CM | POA: Insufficient documentation

## 2013-10-02 DIAGNOSIS — Z9861 Coronary angioplasty status: Secondary | ICD-10-CM | POA: Insufficient documentation

## 2013-10-02 DIAGNOSIS — M542 Cervicalgia: Secondary | ICD-10-CM | POA: Insufficient documentation

## 2013-10-02 DIAGNOSIS — Z951 Presence of aortocoronary bypass graft: Secondary | ICD-10-CM | POA: Insufficient documentation

## 2013-10-02 DIAGNOSIS — M25519 Pain in unspecified shoulder: Secondary | ICD-10-CM | POA: Insufficient documentation

## 2013-10-02 DIAGNOSIS — Z87891 Personal history of nicotine dependence: Secondary | ICD-10-CM | POA: Insufficient documentation

## 2013-10-02 DIAGNOSIS — I2510 Atherosclerotic heart disease of native coronary artery without angina pectoris: Secondary | ICD-10-CM | POA: Insufficient documentation

## 2013-10-02 DIAGNOSIS — I200 Unstable angina: Secondary | ICD-10-CM

## 2013-10-02 DIAGNOSIS — Z7982 Long term (current) use of aspirin: Secondary | ICD-10-CM | POA: Insufficient documentation

## 2013-10-02 DIAGNOSIS — Z794 Long term (current) use of insulin: Secondary | ICD-10-CM | POA: Insufficient documentation

## 2013-10-02 LAB — CBC
HEMATOCRIT: 45.9 % (ref 39.0–52.0)
HEMOGLOBIN: 15.2 g/dL (ref 13.0–17.0)
MCH: 29.9 pg (ref 26.0–34.0)
MCHC: 33.1 g/dL (ref 30.0–36.0)
MCV: 90.2 fL (ref 78.0–100.0)
Platelets: 216 10*3/uL (ref 150–400)
RBC: 5.09 MIL/uL (ref 4.22–5.81)
RDW: 12.5 % (ref 11.5–15.5)
WBC: 6.5 10*3/uL (ref 4.0–10.5)

## 2013-10-02 LAB — BASIC METABOLIC PANEL
BUN: 14 mg/dL (ref 6–23)
CHLORIDE: 93 meq/L — AB (ref 96–112)
CO2: 27 meq/L (ref 19–32)
Calcium: 9.4 mg/dL (ref 8.4–10.5)
Creatinine, Ser: 0.84 mg/dL (ref 0.50–1.35)
GFR calc Af Amer: >90 mL/min (ref >90)
GFR calc non Af Amer: >90 mL/min (ref >90)
Glucose, Bld: 234 mg/dL — ABNORMAL HIGH (ref 70–99)
Potassium: 3.8 mEq/L (ref 3.7–5.3)
Sodium: 135 mEq/L — ABNORMAL LOW (ref 137–147)

## 2013-10-02 LAB — COMPREHENSIVE METABOLIC PANEL
ALBUMIN: 3.4 g/dL — AB (ref 3.5–5.2)
ALT: 14 U/L (ref 0–53)
AST: 12 U/L (ref 0–37)
Alkaline Phosphatase: 133 U/L — ABNORMAL HIGH (ref 39–117)
BILIRUBIN TOTAL: 0.2 mg/dL — AB (ref 0.3–1.2)
BUN: 19 mg/dL (ref 6–23)
CALCIUM: 9.4 mg/dL (ref 8.4–10.5)
CHLORIDE: 93 meq/L — AB (ref 96–112)
CO2: 26 mEq/L (ref 19–32)
CREATININE: 1.13 mg/dL (ref 0.50–1.35)
GFR calc Af Amer: 85 mL/min — ABNORMAL LOW (ref >90)
GFR calc non Af Amer: 73 mL/min — ABNORMAL LOW (ref >90)
Glucose, Bld: 221 mg/dL — ABNORMAL HIGH (ref 70–99)
Potassium: 4 mEq/L (ref 3.7–5.3)
Sodium: 134 mEq/L — ABNORMAL LOW (ref 137–147)
TOTAL PROTEIN: 7.2 g/dL (ref 6.0–8.3)

## 2013-10-02 LAB — CBC WITH DIFFERENTIAL/PLATELET
Basophils Absolute: 0 10*3/uL (ref 0.0–0.1)
Basophils Relative: 1 % (ref 0–1)
EOS PCT: 3 % (ref 0–5)
Eosinophils Absolute: 0.2 10*3/uL (ref 0.0–0.7)
HCT: 42.1 % (ref 39.0–52.0)
HEMOGLOBIN: 14.1 g/dL (ref 13.0–17.0)
LYMPHS ABS: 1.8 10*3/uL (ref 0.7–4.0)
Lymphocytes Relative: 28 % (ref 12–46)
MCH: 30 pg (ref 26.0–34.0)
MCHC: 33.5 g/dL (ref 30.0–36.0)
MCV: 89.6 fL (ref 78.0–100.0)
Monocytes Absolute: 0.6 10*3/uL (ref 0.1–1.0)
Monocytes Relative: 9 % (ref 3–12)
Neutro Abs: 3.8 10*3/uL (ref 1.7–7.7)
Neutrophils Relative %: 59 % (ref 43–77)
Platelets: 223 10*3/uL (ref 150–400)
RBC: 4.7 MIL/uL (ref 4.22–5.81)
RDW: 12.4 % (ref 11.5–15.5)
WBC: 6.4 10*3/uL (ref 4.0–10.5)

## 2013-10-02 LAB — TROPONIN I: Troponin I: <0.3 ng/mL (ref <0.30)

## 2013-10-02 LAB — GLUCOSE, CAPILLARY: GLUCOSE-CAPILLARY: 231 mg/dL — AB (ref 70–99)

## 2013-10-02 MED ORDER — ACTIVE PARTNERSHIP FOR HEALTH OF YOUR HEART BOOK
Freq: Once | Status: AC
  Administered 2013-10-02: 03:00:00
  Filled 2013-10-02: qty 1

## 2013-10-02 MED ORDER — LIVING WELL WITH DIABETES BOOK
Freq: Once | Status: AC
  Administered 2013-10-02: 03:00:00
  Filled 2013-10-02: qty 1

## 2013-10-02 MED ORDER — NITROGLYCERIN 0.4 MG SL SUBL: 0.4000 mg | SUBLINGUAL_TABLET | SUBLINGUAL | Status: AC | PRN

## 2013-10-02 MED ORDER — IBUPROFEN 600 MG PO TABS
600.0000 mg | ORAL_TABLET | Freq: Three times a day (TID) | ORAL | Status: AC
Start: 2013-10-02 — End: 2013-10-06

## 2013-10-02 MED ORDER — CLOPIDOGREL BISULFATE 75 MG PO TABS: 75.0000 mg | ORAL_TABLET | Freq: Every day | ORAL | Status: AC

## 2013-10-02 MED ORDER — DIAZEPAM 5 MG PO TABS: 5.0000 mg | ORAL_TABLET | Freq: Two times a day (BID) | ORAL | Status: DC

## 2013-10-02 MED ORDER — ISOSORBIDE MONONITRATE ER 60 MG PO TB24: 60.0000 mg | ORAL_TABLET | Freq: Every day | ORAL | Status: AC

## 2013-10-02 MED ORDER — OXYCODONE-ACETAMINOPHEN 5-325 MG PO TABS: 1.0000 | ORAL_TABLET | ORAL | Status: AC | PRN

## 2013-10-02 NOTE — Progress Notes (Signed)
CARDIAC REHAB PHASE I   PRE:  Rate/Rhythm: 67 sinus  BP:  Supine: 96/68     MODE:  Ambulation: 300 ft   POST:  Rate/Rhythem: 80 sinus  BP:  Sitting: 149/101    Pt ambulated 300 ft with assist x1.  Pt tolerated walk well, c/o fatigue and weak legs at end of walk.  Pt returned to bedside after walk.  Reviewed d/c education discussing diet, exercise, stent/Plavix, NTG use, when to call 911/MD, restrictions.  Heavy focus on diet and exercise.  Pt is interested in Cardiac Rehab Phase II but worried about potential copay.  Pt encouraged to call to verify coverage and copay with CPT code in brochure.  Pt voiced understanding. Fabio PierceJessica Hawkins, MA, ACSM RCEP [phone]  Hazle NordmannJessica N Hawkins

## 2013-10-02 NOTE — Discharge Instructions (Signed)
As discussed, your evaluation today has been largely reassuring.  But, it is important that you monitor your condition carefully, and do not hesitate to return to the ED if you develop new, or concerning changes in your condition. ? ?Otherwise, please follow-up with your physician for appropriate ongoing care. ? ?

## 2013-10-02 NOTE — ED Notes (Signed)
Pt reports recently having cardiac cath, stent placed. Pt was dc home today with sharp pains to left shoulder, increases with movement, unable to sleep. ekg being done at triage.

## 2013-10-02 NOTE — ED Provider Notes (Signed)
CSN: [id number]     Arrival date & time 10/02/13  1759 History   First MD Initiated Contact with Patient 10/02/13 1819     Chief Complaint  Patient presents with  . Shoulder Pain     HPI  Patient presents for same day of discharge, with concern of ongoing left shoulder pain. Pain has been present for several days, including during a recent hospitalization for chest pain that resulted in catheterization 2 days ago, with stenting of occluded vessels. Patient history of chronic neck issues, including surgery in the neck one year ago. Notes that several days ago he developed left neck pain without clear precipitant.  Since onset the pain has been persistent, and the left superior lateral trapezius area.  The pain is sore, with radiation down the left upper arm. There is no radiation towards the head or chest. Pain is slightly improved with medication use, worse with motion. No new dyspnea, nausea, vomiting, fever, chills.   Past Medical History  Diagnosis Date  . CAD (coronary artery disease) 09/2013     proximal to mid right coronary from 50/80% to 0% with   . OSA on CPAP   . COPD (chronic obstructive pulmonary disease)   . History of stroke   . Diabetes mellitus   . Hyperlipidemia   . HTN (hypertension)   . Peripheral autonomic neuropathy due to DM   . Nephrolithiasis    Past Surgical History  Procedure Laterality Date  . Carotid endarterectomy Left   . Coronary artery bypass graft  2000    left radial to diagonal, RIMA to OM and LIMA to LAD  . Coronary angioplasty with stent placement      bare metal x 2.4 x 12 and 4 x 8 to mid and distal RC   Family History  Problem Relation Age of Onset  . Peripheral vascular disease Mother   . Diabetes Mother   . Heart disease Mother   . Heart disease Father   . Diabetes Father   . Peripheral vascular disease Father    History  Substance Use Topics  . Smoking status: Former Smoker -- 2.00 packs/day for 30 years    Types:  Cigarettes    Start date: 01/12/1973    Quit date: 01/13/2003  . Smokeless tobacco: Not on file  . Alcohol Use: Yes     Comment: States he typically drinks 2 24 ounce beers daily and one pint of gin once a week.    Review of Systems  Constitutional:       Per HPI, otherwise negative  HENT:       Per HPI, otherwise negative  Respiratory:       Per HPI, otherwise negative  Cardiovascular:       Per HPI, otherwise negative  Gastrointestinal: Negative for vomiting.  Endocrine:       Negative aside from HPI  Genitourinary:       Neg aside from HPI   Musculoskeletal:       Per HPI, otherwise negative  Skin: Negative.   Neurological: Negative for syncope.      Allergies  Review of patient's allergies indicates no known allergies.  Home Medications   Prior to Admission medications   Medication Sig Start Date End Date Taking? Authorizing Provider  albuterol (PROVENTIL HFA;VENTOLIN HFA) 108 (90 BASE) MCG/ACT inhaler Inhale 1 puff into the lungs every 6 (six) hours as needed for wheezing or shortness of breath.   Yes Historical Provider, MD  aspirin 81  MG chewable tablet Chew 81 mg by mouth daily.   Yes Historical Provider, MD  carvedilol (COREG) 25 MG tablet Take 25 mg by mouth 2 (two) times daily with a meal.   Yes Historical Provider, MD  clopidogrel (PLAVIX) 75 MG tablet Take 1 tablet (75 mg total) by mouth daily with breakfast. 10/02/13  Yes Jodelle GrossKathryn M Lawrence, NP  furosemide (LASIX) 40 MG tablet Take 40 mg by mouth daily.   Yes Historical Provider, MD  gabapentin (NEURONTIN) 300 MG capsule Take 600 mg by mouth 3 (three) times daily.   Yes Historical Provider, MD  insulin aspart (NOVOLOG) 100 UNIT/ML injection Inject 5-10 Units into the skin 3 (three) times daily before meals. 10 units with meals 5 units with snack   Yes Historical Provider, MD  insulin glargine (LANTUS) 100 UNIT/ML injection Inject 60 Units into the skin at bedtime.   Yes Historical Provider, MD  isosorbide  mononitrate (IMDUR) 60 MG 24 hr tablet Take 1 tablet (60 mg total) by mouth daily. 10/02/13  Yes Jodelle GrossKathryn M Lawrence, NP  losartan (COZAAR) 50 MG tablet Take 50 mg by mouth daily.   Yes Historical Provider, MD  metFORMIN (GLUCOPHAGE) 1000 MG tablet Take 1,000 mg by mouth 2 (two) times daily with a meal.   Yes Historical Provider, MD  omeprazole (PRILOSEC) 20 MG capsule Take 20 mg by mouth daily.   Yes Historical Provider, MD  rosuvastatin (CRESTOR) 40 MG tablet Take 40 mg by mouth daily.   Yes Historical Provider, MD  tiotropium (SPIRIVA) 18 MCG inhalation capsule Place 18 mcg into inhaler and inhale daily.   Yes Historical Provider, MD  nitroGLYCERIN (NITROSTAT) 0.4 MG SL tablet Place 1 tablet (0.4 mg total) under the tongue every 5 (five) minutes x 3 doses as needed for chest pain. 10/02/13   Jodelle GrossKathryn M Lawrence, NP   BP 125/58  Pulse 78  Temp(Src) 97.2 F (36.2 C) (Oral)  Resp 18  Ht 5\' 6"  (1.676 m)  Wt 298 lb 8 oz (135.399 kg)  BMI 48.20 kg/m2  SpO2 98% Physical Exam  Nursing note and vitals reviewed. Constitutional: He is oriented to person, place, and time. He appears well-developed. No distress.  HENT:  Head: Normocephalic and atraumatic.  Eyes: Conjunctivae and EOM are normal.  Cardiovascular: Normal rate and regular rhythm.   Pulmonary/Chest: Effort normal. No stridor. No respiratory distress.  Abdominal: He exhibits no distension.  Musculoskeletal: He exhibits no edema.       Arms:      Legs: Neurological: He is alert and oriented to person, place, and time.  Skin: Skin is warm and dry.  Psychiatric: He has a normal mood and affect.    ED Course  Procedures (including critical care time) Labs Review Labs Reviewed  COMPREHENSIVE METABOLIC PANEL - Abnormal; Notable for the following:    Sodium 134 (*)    Chloride 93 (*)    Glucose, Bld 221 (*)    Albumin 3.4 (*)    Alkaline Phosphatase 133 (*)    Total Bilirubin 0.2 (*)    GFR calc non Af Amer 73 (*)    GFR calc Af  Amer 85 (*)    All other components within normal limits  CBC WITH DIFFERENTIAL  TROPONIN I    Imaging Review Dg Chest Port 1 View  09/30/2013   CLINICAL DATA:  Left lateral neck pain radiating into the shoulder. Shortness of breath.  EXAM: PORTABLE CHEST - 1 VIEW  COMPARISON:  None.  FINDINGS:  2007 hr. The heart is mildly enlarged status post median sternotomy and CABG. The lungs are clear. There is no pleural effusion or pneumothorax. No acute osseous findings are evident.  IMPRESSION: Mild cardiomegaly post CABG.  No acute cardiopulmonary process.   Electronically Signed   By: Roxy Horseman M.D.   On: 09/30/2013 20:20   EKG shows sinus rhythm, 79, with sinus arrhythmia, prior infarct pattern, abnormal  7:56 PM I discussed the patient's case with our cardiologist.  The cardiologist evaluated the patient earlier this morning prior to discharge, noted that the patient was in no distress at that point.  We discussed evaluation tonight, and agreed that if the patient's troponin is unremarkable, with unremarkable EKG, he will be following up in clinic, and does not require additional evaluation. MDM   Final diagnoses:  Shoulder pain    Patient presents with ongoing left shoulder pain.  Notably, the patient had catheterization 2 days ago, with stenting. Patient states that the pain has been present prior to his initial presentation for pain requiring catheterization, and on my exam is in no distress, hemodynamically stable, with no neurologic deficits. Toes the patient has left shoulder pain, he has no chest pain, no radiation to the left arm, and story is inconsistent with atypical ACS. Patient is file for medication. After his initial evaluation I discussed his case with our cardiology team, who is familiar with the patient. With a negative troponin, there is low suspicion for coronary ischemia given the other aforementioned findings. Patient was discharged in stable condition to follow up in  clinic.     Gerhard Munch, MD 10/03/13 [phone]

## 2013-10-02 NOTE — Discharge Summary (Signed)
Physician Discharge Summary  Patient ID: Mike Manning MRN: [id number] DOB/AGE: 11-26-1960 53 y.o.  Admit date: 09/30/2013 Discharge date: 10/02/2013  Primary Discharge Diagnosis: 1. Unstable Angina 2. CAD-S/P PCI of the RCA  Secondary Discharge Diagnosis:  1. CAD: S/P CABG LIMA to LAD, Free Radial graft to diagonal, Free Radial Graft to OM. 2. Diabetes 3.Systolic Dysfunction   Primary Cardiologist:Formerly, Pernell Dupre of [facility], Now to be assigned to Dr. Katrinka Blazing.   Significant Diagnostic Studies: Cardiac Cath 10/01/2013 1. 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 The distal RCA including the PDA and left ventricular branches are diffusely diseased.  2. Patent bypass grafts with LIMA to LAD, free radial graft to diagonal, and free right internal mammary graft to obtuse marginal showing no evidence of obstruction.  3. Successful stenting of the proximal to mid right coronary from 50/80% to 0% with TIMI grade 3 flow. The right coronary distal territory including the PDA and left ventricular branches a left with multifocal significant stenoses which may continue to be a source of angina although I believe his exercise tolerance will be improved by increasing inflow.  4. Ischemic cardiomyopathy   Hospital Course: Mike Manning is a 53 year old obese male who is already followed at [facility] by Dr. Devonne Doughty cardiology. The patient has a history of coronary artery bypass grafting in 2000, with LIMA to LAD RIMA to OM and left radial to diagonal, ischemic cardiomyopathy with an EF of 35%.. The patient was admitted with unstable angina class IV with complaints of left shoulder pain consistent with his prior anginal symptoms and chest pressure. The patient was started on Plavix on admission, continued on aspirin  beta blocker nitrates and statin. And plan for cardiac catheterization.  Cardiac catheterization was completed on 10/01/2013. This demonstrated 80% proximal mid right coronary artery stenosis, which responded to PCI with Xience DES reducing to 0%. LVEF was calculated at 53% to 30%. He was recommended for aspirin and Plavix indefinitely.  The patient was continued on all other medications he was receiving prior to admission. Metformin was held for 2 days post procedure. The patient was also interacted with cardiac rehabilitation with recommendations to continue as outpatient. Lipitor was increased to 80 mg daily. Carvedilol 25 mg twice a day.  The patient wishes to followup with a local cardiologist, and not travel to [facility]. He has a followup appointment scheduled with Dr. Verdis Prime, With our office calling to make that appointment.   Discharge Exam: Blood pressure 119/78, pulse 80, temperature 97.7 F (36.5 C), temperature source Oral, resp. rate 18, height 5\' 6"  (1.676 m), weight 293 lb 3.4 oz (133 kg), SpO2 93.00%.   General: Well developed, well nourished, in no acute distress Head: Eyes PERRLA, No xanthomas.   Normal cephalic and atramatic  Lungs: Clear bilaterally to auscultation and percussion. Heart: HRRR S1 S2, without MRG.  Pulses are 2+ & equal.            No carotid bruit. No JVD.  No abdominal bruits. No femoral bruits. Abdomen: Bowel sounds are positive, abdomen soft and non-tender without masses or                  Hernia's noted. Msk:  Back normal, normal gait. Normal strength and tone for age. Extremities: No clubbing, cyanosis or edema. Right groin site healthy,  without evidence of bleeding hematoma or infection. DP +1 Neuro: Alert and oriented X 3. Psych:  Good affect, responds appropriately  Labs:   Lab Results  Component Value Date   WBC 6.5 10/02/2013   HGB 15.2 10/02/2013   HCT 45.9 10/02/2013   MCV 90.2 10/02/2013   PLT 216 10/02/2013    Recent Labs Lab  10/02/13 0500  NA 135*  K 3.8  CL 93*  CO2 27  BUN 14  CREATININE 0.84  CALCIUM 9.4  GLUCOSE 234*   Lab Results  Component Value Date   TROPONINI <0.30 10/01/2013    Lab Results  Component Value Date   CHOL 303* 10/01/2013   Lab Results  Component Value Date   HDL 36* 10/01/2013   Lab Results  Component Value Date   LDLCALC 222* 10/01/2013   Lab Results  Component Value Date   TRIG 224* 10/01/2013   Lab Results  Component Value Date   CHOLHDL 8.4 10/01/2013   No results found for this basename: LDLDIRECT      Radiology: Dg Chest Port 1 View  09/30/2013   CLINICAL DATA:  Left lateral neck pain radiating into the shoulder. Shortness of breath.  EXAM: PORTABLE CHEST - 1 VIEW  COMPARISON:  None.  FINDINGS: 2007 hr. The heart is mildly enlarged status post median sternotomy and CABG. The lungs are clear. There is no pleural effusion or pneumothorax. No acute osseous findings are evident.  IMPRESSION: Mild cardiomegaly post CABG.  No acute cardiopulmonary process.   Electronically Signed   By: Roxy HorsemanBill  Veazey M.D.   On: 09/30/2013 20:20    EKG: Normal sinus rhythm with PACs, and evidence of prior anterior infarction.  FOLLOW UP PLANS AND APPOINTMENTS Discharge Orders   Future Orders Complete By Expires   Amb Referral to Cardiac Rehabilitation  As directed    Diet - low sodium heart healthy  As directed    Increase activity slowly  As directed        Medication List         albuterol 108 (90 BASE) MCG/ACT inhaler  Commonly known as:  PROVENTIL HFA;VENTOLIN HFA  Inhale 1 puff into the lungs every 6 (six) hours as needed for wheezing or shortness of breath.     aspirin 81 MG chewable tablet  Chew 81 mg by mouth daily.     carvedilol 25 MG tablet  Commonly known as:  COREG  Take 25 mg by mouth 2 (two) times daily with a meal.     clopidogrel 75 MG tablet  Commonly known as:  PLAVIX  Take 1 tablet (75 mg total) by mouth daily with breakfast.     furosemide 40 MG  tablet  Commonly known as:  LASIX  Take 40 mg by mouth daily.     gabapentin 300 MG capsule  Commonly known as:  NEURONTIN  Take 600 mg by mouth 3 (three) times daily.     insulin aspart 100 UNIT/ML injection  Commonly known as:  novoLOG  Inject 5-10 Units into the skin 3 (three) times daily before meals. 10 units with meals 5 units with snack     insulin glargine 100 UNIT/ML injection  Commonly known as:  LANTUS  Inject 60 Units into the skin at bedtime.     isosorbide mononitrate 60 MG 24 hr tablet  Commonly known as:  IMDUR  Take 1 tablet (60 mg total) by mouth daily.     losartan 50 MG tablet  Commonly known  as:  COZAAR  Take 50 mg by mouth daily.     metFORMIN 1000 MG tablet  Commonly known as:  GLUCOPHAGE  Take 1,000 mg by mouth 2 (two) times daily with a meal.     nitroGLYCERIN 0.4 MG SL tablet  Commonly known as:  NITROSTAT  Place 1 tablet (0.4 mg total) under the tongue every 5 (five) minutes x 3 doses as needed for chest pain.     omeprazole 20 MG capsule  Commonly known as:  PRILOSEC  Take 20 mg by mouth daily.     rosuvastatin 40 MG tablet  Commonly known as:  CRESTOR  Take 40 mg by mouth daily.     tiotropium 18 MCG inhalation capsule  Commonly known as:  SPIRIVA  Place 18 mcg into inhaler and inhale daily.           Follow-up Information   Follow up with Lesleigh NoeSMITH III,HENRY W, MD. (Our office will call you for appt. )    Specialty:  Cardiology   Contact information:   [address] [place] [address] [phone]         Time spent with patient to include physician time: 4235 mintues Signed: Jodelle GrossKathryn M Lawrence 10/02/2013, 8:52 AM Co-Sign MD   I have seen, examined the patient, and reviewed the above.  Changes to above are made where necessary.  DC to home today with close outpatient follow-up  Co Sign: Hillis RangeJames Javante Nilsson, MD 10/02/2013 10:38 AM

## 2013-10-02 NOTE — ED Notes (Addendum)
Called lab to follow up on pt troponin, spoke with valeria that states troponin is being ran now and should result soon. Pt and MD made aware.

## 2013-10-04 MED FILL — Bivalirudin Trifluoroacetate For IV Soln 250 MG (Base Equiv): INTRAVENOUS | Qty: 250 | Status: AC

## 2013-10-04 MED FILL — Dextrose Inj 5%: INTRAVENOUS | Qty: 50 | Status: AC

## 2013-10-05 NOTE — Care Management Note (Addendum)
    Page 1 of 1   10/04/2013     3:35:23 PM CARE MANAGEMENT NOTE 10/04/2013  Patient:  Mike Manning,Mike Manning   Account Number:  [id number]  Date Initiated:  10/04/2013  Documentation initiated by:  Donato SchultzHUTCHINSON,CRYSTAL  Subjective/Objective Assessment:   Admitted with chest pain     Action/Plan:   CM to follow for dispositon needs   Anticipated DC Date:  10/02/2013   Anticipated DC Plan:  HOME/SELF CARE         Choice offered to / List presented to:             Status of service:  Completed, signed off Medicare Important Message given?   (If response is "NO", the following Medicare IM given date fields will be blank) Date Medicare IM given:   Date Additional Medicare IM given:    Discharge Disposition:  HOME/SELF CARE  Per UR Regulation:  Reviewed for med. necessity/level of care/duration of stay  If discussed at Long Length of Stay Meetings, dates discussed:    Comments:  CM med review:  Plavix D/c home self care 10/02/2013 No other CM needs identified this admission. Crystal Hutchinson RN, BSN, [facility], [place] 10/04/2013

## 2013-10-11 ENCOUNTER — Ambulatory Visit (INDEPENDENT_AMBULATORY_CARE_PROVIDER_SITE_OTHER): Payer: Medicare Other | Admitting: Cardiology

## 2013-10-11 DIAGNOSIS — E7800 Pure hypercholesterolemia, unspecified: Secondary | ICD-10-CM

## 2013-10-21 NOTE — Progress Notes (Signed)
   Patient ID: Mike Manning, male    DOB: 11/30/1960, 53 y.o.   MRN: [id number]  HPI    Review of Systems    Physical Exam     No show/cancelled appt.  Donato SchultzMark Skains, MD

## 2013-11-05 ENCOUNTER — Ambulatory Visit (INDEPENDENT_AMBULATORY_CARE_PROVIDER_SITE_OTHER): Payer: Medicare Other | Admitting: Cardiology

## 2013-11-05 ENCOUNTER — Encounter: Payer: Self-pay | Admitting: Cardiology

## 2013-11-05 VITALS — BP 100/80 | HR 111 | Ht 66.0 in | Wt 286.0 lb

## 2013-11-05 DIAGNOSIS — I498 Other specified cardiac arrhythmias: Secondary | ICD-10-CM

## 2013-11-05 DIAGNOSIS — I739 Peripheral vascular disease, unspecified: Secondary | ICD-10-CM

## 2013-11-05 DIAGNOSIS — E1149 Type 2 diabetes mellitus with other diabetic neurological complication: Secondary | ICD-10-CM

## 2013-11-05 DIAGNOSIS — I10 Essential (primary) hypertension: Secondary | ICD-10-CM

## 2013-11-05 DIAGNOSIS — G4733 Obstructive sleep apnea (adult) (pediatric): Secondary | ICD-10-CM

## 2013-11-05 DIAGNOSIS — R000 Tachycardia, unspecified: Secondary | ICD-10-CM

## 2013-11-05 DIAGNOSIS — E1140 Type 2 diabetes mellitus with diabetic neuropathy, unspecified: Secondary | ICD-10-CM

## 2013-11-05 DIAGNOSIS — I252 Old myocardial infarction: Secondary | ICD-10-CM

## 2013-11-05 DIAGNOSIS — I5022 Chronic systolic (congestive) heart failure: Secondary | ICD-10-CM

## 2013-11-05 DIAGNOSIS — I25810 Atherosclerosis of coronary artery bypass graft(s) without angina pectoris: Secondary | ICD-10-CM

## 2013-11-05 DIAGNOSIS — I779 Disorder of arteries and arterioles, unspecified: Secondary | ICD-10-CM

## 2013-11-05 LAB — LIPID PANEL
CHOLESTEROL: 318 mg/dL — AB (ref 0–200)
HDL: 34.8 mg/dL — AB (ref >39.00)
LDL Cholesterol: 244 mg/dL — ABNORMAL HIGH (ref 0–99)
Total CHOL/HDL Ratio: 9
Triglycerides: 196 mg/dL — ABNORMAL HIGH (ref 0.0–149.0)
VLDL: 39.2 mg/dL (ref 0.0–40.0)

## 2013-11-05 LAB — HEMOGLOBIN A1C: HEMOGLOBIN A1C: 10.4 % — AB (ref 4.6–6.5)

## 2013-11-05 NOTE — Progress Notes (Signed)
[address] [place], [place]  [address] Phone: [phone] Fax:  [phone]  Date:  11/05/2013   ID:  Mike Manning, DOB [birth year], MRN [id number]  PCP:  PROVIDER NOT IN SYSTEM   History of Present Illness: Transition of care visit Mike Manning is a 53 y.o. male with coronary artery disease status post bypass surgery with chronic systolic heart failure, ischemic cardiomyopathy ejection fraction 35% with cardiac catheterization on 10/01/13 demonstrating an 80% proximal mid right coronary artery stenosis, DES placed, Plavix and aspirin indefinitely by Dr. Katrinka Blazing. Formerly seen at [facility], Dr. Pernell Dupre. He had patent bypass grafts, LIMA to LAD, free radial to diagonal, free right internal mammary graft to obtuse marginal.  Currently he is doing quite well. States that he is compliant with his medications. Not having any significant shortness of breath with activity. He is eager to establish with a primary physician. No chest pain.   Wt Readings from Last 3 Encounters:  11/05/13 286 lb (129.729 kg)  10/02/13 298 lb 8 oz (135.399 kg)  10/02/13 293 lb 3.4 oz (133 kg)     Past Medical History  Diagnosis Date  . CAD (coronary artery disease) 09/2013     proximal to mid right coronary from 50/80% to 0% with   . OSA on CPAP   . COPD (chronic obstructive pulmonary disease)   . History of stroke   . Diabetes mellitus   . Hyperlipidemia   . HTN (hypertension)   . Peripheral autonomic neuropathy due to DM   . Nephrolithiasis     Past Surgical History  Procedure Laterality Date  . Carotid endarterectomy Left   . Coronary artery bypass graft  2000    left radial to diagonal, RIMA to OM and LIMA to LAD  . Coronary angioplasty with stent placement      bare metal x 2.4 x 12 and 4 x 8 to mid and distal RC    Current Outpatient Prescriptions  Medication Sig Dispense Refill  . albuterol (PROVENTIL HFA;VENTOLIN HFA) 108 (90 BASE) MCG/ACT inhaler Inhale 1 puff into the  lungs every 6 (six) hours as needed for wheezing or shortness of breath.      Marland Kitchen aspirin EC 81 MG tablet Take 81 mg by mouth.      . carvedilol (COREG) 25 MG tablet Take 25 mg by mouth 2 (two) times daily with a meal.      . clopidogrel (PLAVIX) 75 MG tablet Take 1 tablet (75 mg total) by mouth daily with breakfast.  30 tablet  11  . furosemide (LASIX) 40 MG tablet Take 40 mg by mouth daily.      Marland Kitchen gabapentin (NEURONTIN) 600 MG tablet Take 600 mg by mouth.      Marland Kitchen ibuprofen (ADVIL) 200 MG tablet 2-3 tabs every 8 hours as needed      . insulin aspart (NOVOLOG) 100 UNIT/ML injection Inject 5-10 Units into the skin 3 (three) times daily before meals. 10 units with meals 5 units with snack      . insulin glargine (LANTUS) 100 UNIT/ML injection Inject 60 Units into the skin at bedtime.      . isosorbide mononitrate (IMDUR) 60 MG 24 hr tablet Take 1 tablet (60 mg total) by mouth daily.  30 tablet  6  . losartan (COZAAR) 50 MG tablet Take 50 mg by mouth.      . metFORMIN (GLUCOPHAGE) 1000 MG tablet Take 1,000 mg by mouth 2 (  two) times daily with a meal.      . nitroGLYCERIN (NITROSTAT) 0.4 MG SL tablet Place 1 tablet (0.4 mg total) under the tongue every 5 (five) minutes x 3 doses as needed for chest pain.  30 tablet  12  . omeprazole (PRILOSEC) 20 MG capsule Take 20 mg by mouth daily.      Marland Kitchen. oxyCODONE-acetaminophen (PERCOCET/ROXICET) 5-325 MG per tablet Take 1 tablet by mouth every 4 (four) hours as needed for severe pain.  10 tablet  0  . rosuvastatin (CRESTOR) 40 MG tablet Take 40 mg by mouth daily.      Marland Kitchen. spironolactone (ALDACTONE) 50 MG tablet        No current facility-administered medications for this visit.    Allergies:   No Known Allergies  Social History:  The patient  reports that he quit smoking about 10 years ago. His smoking use included Cigarettes. He started smoking about 40 years ago. He has a 60 pack-year smoking history. He does not have any smokeless tobacco history on file. He  reports that he drinks alcohol. He reports that he does not use illicit drugs.   Family History  Problem Relation Age of Onset  . Peripheral vascular disease Mother   . Diabetes Mother   . Heart disease Mother   . Heart disease Father   . Diabetes Father   . Peripheral vascular disease Father     ROS:  Please see the history of present illness.  No syncope, no bleeding, no orthopnea, no PND. Prior shoulder pain, ER visit, troponin normal. Post catheterization.   All other systems reviewed and negative.   PHYSICAL EXAM: VS:  BP 100/80  Pulse 111  Ht 5\' 6"  (1.676 m)  Wt 286 lb (129.729 kg)  BMI 46.18 kg/m2 Well nourished, well developed, in no acute distress HEENT: normal, [place]/AT, EOMI Neck: no JVD, normal carotid upstroke, no bruit Cardiac:  Tachycardic, regular no murmurChest scar noted Lungs:  clear to auscultation bilaterally, no wheezing, rhonchi or rales Abd: soft, nontender, no hepatomegaly, no bruitsObese Ext: no edema, 2+ distal pulses Skin: warm and dryRadial artery harvest sites noted GU: deferred Neuro: no focal abnormalities noted, AAO x 3  EKG:  11/05/13-sinus tachycardia rate 109, nonspecific ST changes Labs reviewed. Hospital records reviewed. Cardiac catheterization reviewed. ASSESSMENT AND PLAN:  1. Chronic systolic heart failure - ischemic cardiomyopathy. Prior bypass surgery. Optimizing cardiac medications. During recent hospitalization with stent placement, carvedilol was increased. 2. Sinus tachycardia-question Carvedilol compliance. Heart rate currently 110. 3. Coronary artery disease-RCA stent 10/02/13-Dr. Katrinka BlazingSmith, DES, Plavix, aspirin indefinitely 4. Old MI-ischemic cardiomyopathy 5. Diabetes with peripheral neuropathy-PCP to be established. Checking A1c. 6. Obstructive sleep apnea-encourage CPAP 7. Hyperlipidemia-Crestor-checking lipids.   Signed, Donato SchultzMark Skains, MD [facility]  11/05/2013 1:51 PM

## 2013-11-05 NOTE — Patient Instructions (Signed)
Lab work today ( lipid,a1c)   Schedule appointment with PCP    Your physician wants you to follow-up in: 4 months. You will receive a reminder letter in the mail two months in advance. If you don't receive a letter, please call our office to schedule the follow-up appointment.

## 2013-11-12 ENCOUNTER — Other Ambulatory Visit: Payer: Self-pay

## 2013-11-12 DIAGNOSIS — E7800 Pure hypercholesterolemia, unspecified: Secondary | ICD-10-CM

## 2013-11-16 ENCOUNTER — Ambulatory Visit: Payer: Medicare Other | Admitting: Pharmacist

## 2013-11-23 ENCOUNTER — Ambulatory Visit (INDEPENDENT_AMBULATORY_CARE_PROVIDER_SITE_OTHER): Payer: Medicare Other | Admitting: Pharmacist

## 2013-11-23 ENCOUNTER — Ambulatory Visit: Payer: Medicare Other | Admitting: Pharmacist

## 2013-11-23 VITALS — Wt 300.0 lb

## 2013-11-23 DIAGNOSIS — E785 Hyperlipidemia, unspecified: Secondary | ICD-10-CM

## 2013-11-23 MED ORDER — ATORVASTATIN CALCIUM 80 MG PO TABS: 80.0000 mg | ORAL_TABLET | Freq: Every day | ORAL | Status: DC

## 2013-11-23 NOTE — Progress Notes (Signed)
Patient is a 53 y.o. AAM referred to lipid clinic by Dr. Anne FuSkains given elevated LDL on Crestor 40 mg qd.  He has a h/o CAD, Diabetes, and DM.  His baseline LDL is well over 200 mg/dL which is likely FH.  Patient tells me that he sometimes misses a few days of Crestor due to finances reasons.  He is on medicare and tells me that his diabetes supplies and some of his brand name medications can be difficult to afford.  Generic medications aren't an issue.  He took lipitor in the past he tells me without any problems, but he was increased to Crestor for potency reasons.  Crestor is now posing a compliance issue due to cost.  LDL was 244 mg/dL on Crestor 40 mg qd recently.    His diet consists of eggs most mornings, and oatmeal on the others.  His lunch and dinner is low fat.  He eats chicken and vegetables most evenings, and rarely eats desserts.  Drinks diet soda, 1 glass per day. Family history is strong for diabetes, with multiple family members with h/o amputations.  His mother died when he was very young, and he doesn't know if it was from heart disease or not.  He has one sibling (brother) who has diabetes, but no CAD to his knowledge.  Patient is now walking every morning for ~ 30 minutes for exercise.  RF:  Diabetes, CAD, CVA, baseline LDL > 190 - LDL goal < 70 Meds:  Crestor 40 mg qd  Labs: 10/2013:  TC 318, TG 196, HDL 35, LDL 244, A1C 10.4 (Crestor 40 mg qd) 09/2013:  LFTs normal  Current Outpatient Prescriptions  Medication Sig Dispense Refill  . albuterol (PROVENTIL HFA;VENTOLIN HFA) 108 (90 BASE) MCG/ACT inhaler Inhale 1 puff into the lungs every 6 (six) hours as needed for wheezing or shortness of breath.      Marland Kitchen. aspirin EC 81 MG tablet Take 81 mg by mouth.      . carvedilol (COREG) 25 MG tablet Take 25 mg by mouth 2 (two) times daily with a meal.      . clopidogrel (PLAVIX) 75 MG tablet Take 1 tablet (75 mg total) by mouth daily with breakfast.  30 tablet  11  . furosemide (LASIX) 40 MG  tablet Take 40 mg by mouth daily.      Marland Kitchen. gabapentin (NEURONTIN) 600 MG tablet Take 600 mg by mouth.      Marland Kitchen. ibuprofen (ADVIL) 200 MG tablet 2-3 tabs every 8 hours as needed      . insulin aspart (NOVOLOG) 100 UNIT/ML injection Inject 5-10 Units into the skin 3 (three) times daily before meals. 10 units with meals 5 units with snack      . insulin glargine (LANTUS) 100 UNIT/ML injection Inject 60 Units into the skin at bedtime.      . isosorbide mononitrate (IMDUR) 60 MG 24 hr tablet Take 1 tablet (60 mg total) by mouth daily.  30 tablet  6  . losartan (COZAAR) 50 MG tablet Take 50 mg by mouth.      . metFORMIN (GLUCOPHAGE) 1000 MG tablet Take 1,000 mg by mouth 2 (two) times daily with a meal.      . nitroGLYCERIN (NITROSTAT) 0.4 MG SL tablet Place 1 tablet (0.4 mg total) under the tongue every 5 (five) minutes x 3 doses as needed for chest pain.  30 tablet  12  . omeprazole (PRILOSEC) 20 MG capsule Take 20 mg by mouth daily.      .Marland Kitchen  oxyCODONE-acetaminophen (PERCOCET/ROXICET) 5-325 MG per tablet Take 1 tablet by mouth every 4 (four) hours as needed for severe pain.  10 tablet  0  . spironolactone (ALDACTONE) 50 MG tablet       . atorvastatin (LIPITOR) 80 MG tablet Take 1 tablet (80 mg total) by mouth daily.  30 tablet  5   No current facility-administered medications for this visit.   No Known Allergies  Family History  Problem Relation Age of Onset  . Peripheral vascular disease Mother   . Diabetes Mother   . Heart disease Mother   . Heart disease Father   . Diabetes Father   . Peripheral vascular disease Father

## 2013-11-23 NOTE — Assessment & Plan Note (Signed)
Patient's LDL is over 200 mg/dL, and may possibly be due to non-compliance with Crestor due to cost.  Will have patient change to lipitor 80 mg qd as he can be compliant with this, and will also get him enrolled into SPIRE-II clinical trial for PCSK-9 inhibitor in order to try and get LDL to < 70 mg/dL.  Patient agreeable to this, and understands trial is using a SQ medication, and it is placebo controlled.  Due to medicare and finances, this will likely be only way of getting patient on combination therapy for LDL > 200 mg/dL + CAD.  I [facility], and they will hopefully be able to get him enrolled.

## 2013-11-23 NOTE — Patient Instructions (Signed)
1.  Stop Crestor. 2.  Start generic lipitor 80 mg once daily. 3.  Try to limit eggs to no more than 3 eggs per week.  Increase oatmeal in morning time. 4.  Will call you to set up getting in to clinical trial.  If problems, call Alfonse RasJeremy Smart at [phone].

## 2014-03-04 ENCOUNTER — Ambulatory Visit: Payer: Medicare Other | Admitting: Cardiology

## 2014-05-19 ENCOUNTER — Encounter (HOSPITAL_COMMUNITY): Payer: Self-pay | Admitting: Interventional Cardiology

## 2014-12-08 ENCOUNTER — Other Ambulatory Visit: Payer: Self-pay | Admitting: Cardiology

## 2015-03-10 IMAGING — CR DG CHEST 2V
2 series · 2 of 2 positions shown · non-contrast
Comparison: DG CHEST 1V PORT dated 09/30/2013

CLINICAL DATA: Left shoulder pain hr short of breath

EXAM:
CHEST  2 VIEW

[w chest pa]
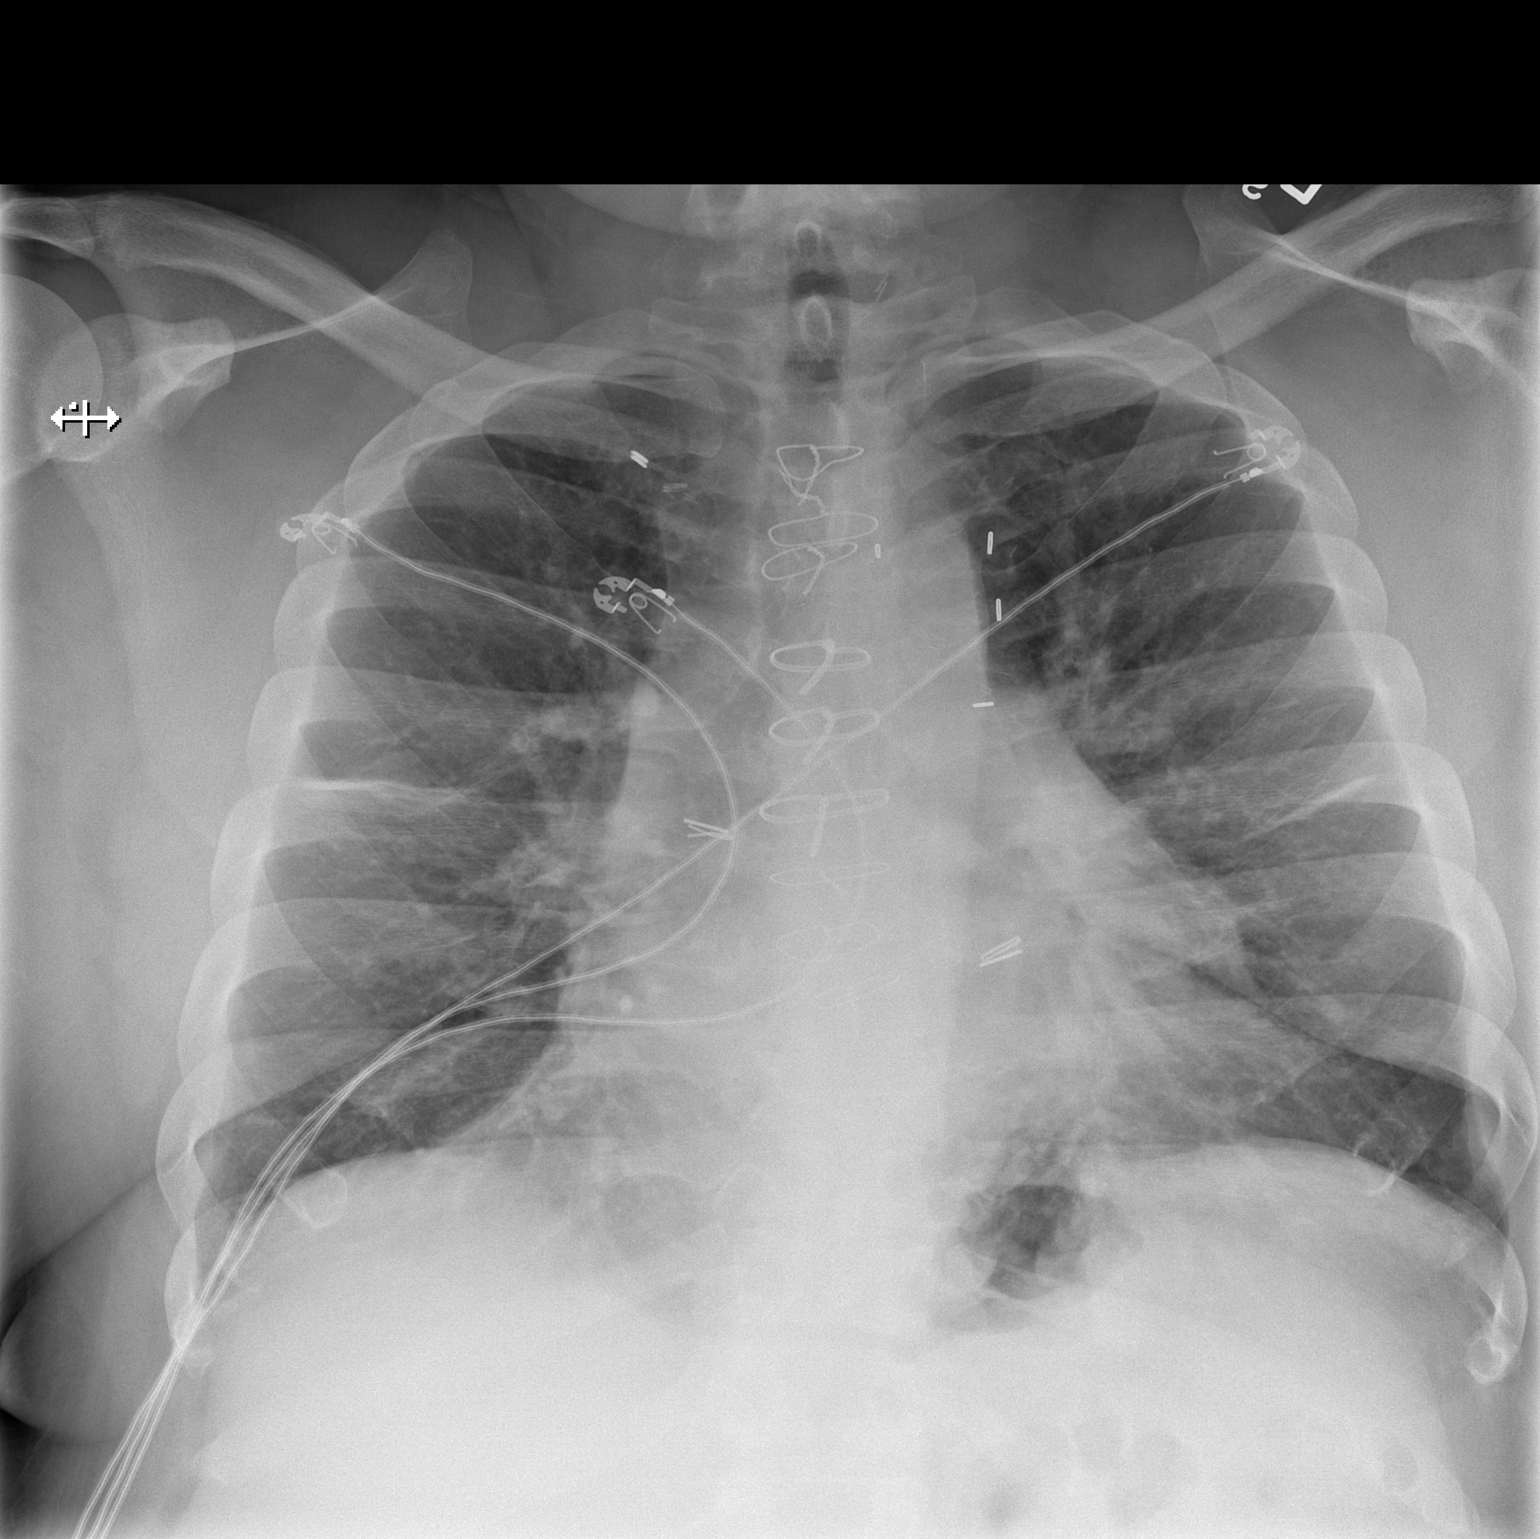

[w chest lat]
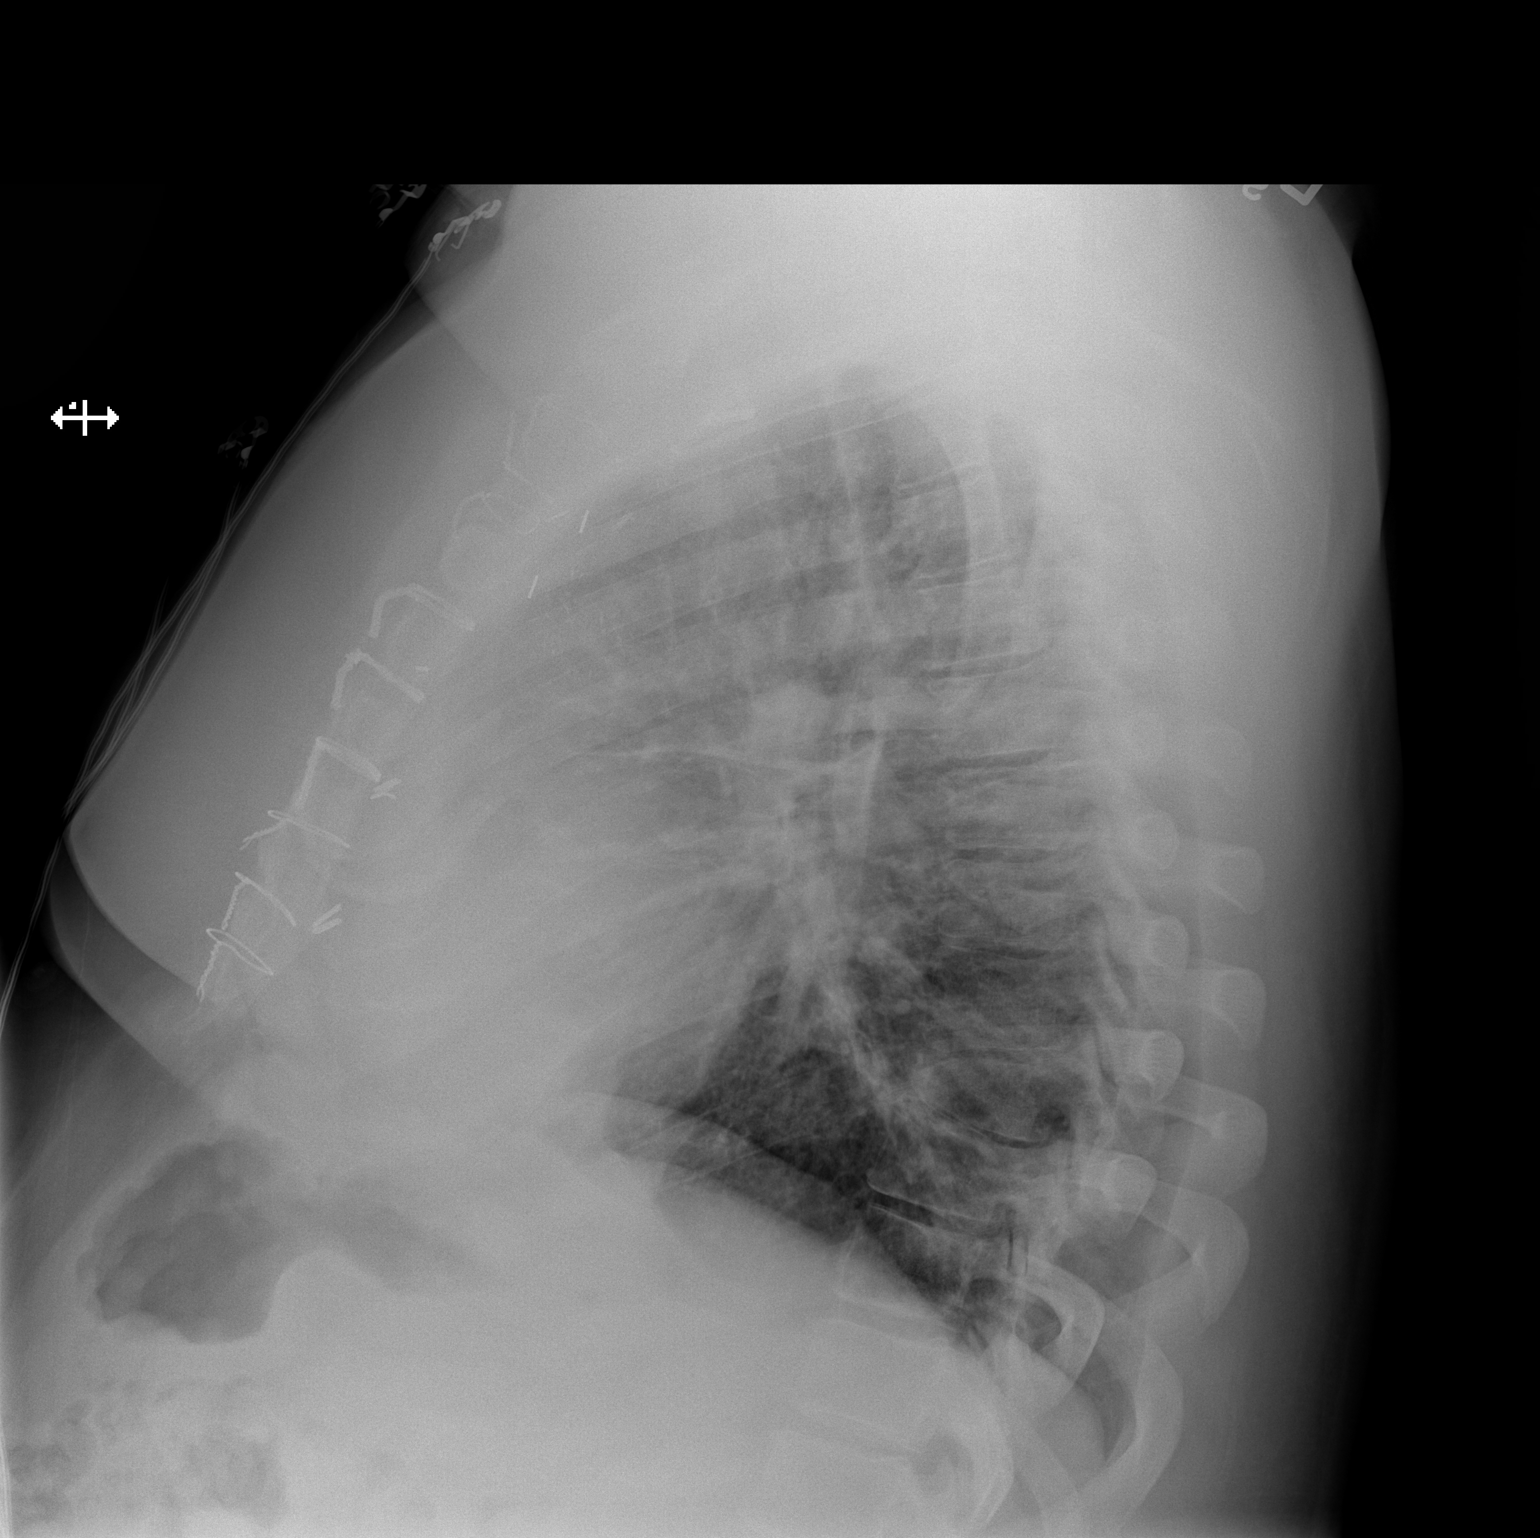

[2 of 2 positions shown; findings below may reference images not displayed]

FINDINGS: Sternotomy wires overlie normal cardiac silhouette. Bibasilar
atelectasis is increased compared to prior. . No effusion,
infiltrate, or pneumothorax.
IMPRESSION: Mild basilar atelectasis.  No infiltrate.

## 2019-07-12 DEATH — deceased
